# Patient Record
Sex: Female | Born: 1965
Health system: Southern US, Community
[De-identification: ages and names within clinical notes are randomized; demographics above are authoritative.]

## PROBLEM LIST (undated history)

## (undated) DIAGNOSIS — F419 Anxiety disorder, unspecified: Secondary | ICD-10-CM

## (undated) DIAGNOSIS — D649 Anemia, unspecified: Secondary | ICD-10-CM

## (undated) DIAGNOSIS — F32A Depression, unspecified: Secondary | ICD-10-CM

## (undated) DIAGNOSIS — I10 Essential (primary) hypertension: Secondary | ICD-10-CM

## (undated) DIAGNOSIS — R7303 Prediabetes: Secondary | ICD-10-CM

## (undated) DIAGNOSIS — F329 Major depressive disorder, single episode, unspecified: Secondary | ICD-10-CM

## (undated) DIAGNOSIS — I341 Nonrheumatic mitral (valve) prolapse: Secondary | ICD-10-CM

## (undated) DIAGNOSIS — M810 Age-related osteoporosis without current pathological fracture: Secondary | ICD-10-CM

## (undated) DIAGNOSIS — K219 Gastro-esophageal reflux disease without esophagitis: Secondary | ICD-10-CM

## (undated) DIAGNOSIS — M1990 Unspecified osteoarthritis, unspecified site: Secondary | ICD-10-CM

## (undated) DIAGNOSIS — C801 Malignant (primary) neoplasm, unspecified: Secondary | ICD-10-CM

## (undated) HISTORY — DX: Anxiety disorder, unspecified: F41.9

## (undated) HISTORY — DX: Malignant (primary) neoplasm, unspecified: C80.1

## (undated) HISTORY — DX: Unspecified osteoarthritis, unspecified site: M19.90

## (undated) HISTORY — PX: BUNIONECTOMY: SHX129

## (undated) HISTORY — DX: Depression, unspecified: F32.A

## (undated) HISTORY — PX: ABDOMINAL HYSTERECTOMY: SHX81

## (undated) HISTORY — DX: Prediabetes: R73.03

## (undated) HISTORY — DX: Nonrheumatic mitral (valve) prolapse: I34.1

## (undated) HISTORY — DX: Anemia, unspecified: D64.9

## (undated) HISTORY — DX: Age-related osteoporosis without current pathological fracture: M81.0

## (undated) HISTORY — DX: Essential (primary) hypertension: I10

## (undated) HISTORY — PX: PARTIAL HYSTERECTOMY: SHX80

## (undated) HISTORY — PX: WISDOM TOOTH EXTRACTION: SHX21

## (undated) HISTORY — PX: APPENDECTOMY: SHX54

## (undated) HISTORY — DX: Gastro-esophageal reflux disease without esophagitis: K21.9

## (undated) HISTORY — PX: OTHER SURGICAL HISTORY: SHX169

---
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
## 2001-05-11 ENCOUNTER — Other Ambulatory Visit: Admission: RE | Admit: 2001-05-11 | Discharge: 2001-05-11 | Payer: Self-pay | Admitting: Family Medicine

## 2002-06-11 ENCOUNTER — Other Ambulatory Visit: Admission: RE | Admit: 2002-06-11 | Discharge: 2002-06-11 | Payer: Self-pay | Admitting: Family Medicine

## 2003-01-03 ENCOUNTER — Other Ambulatory Visit: Admission: RE | Admit: 2003-01-03 | Discharge: 2003-01-03 | Payer: Self-pay | Admitting: Gastroenterology

## 2003-06-19 ENCOUNTER — Emergency Department (HOSPITAL_COMMUNITY): Admission: EM | Admit: 2003-06-19 | Discharge: 2003-06-19 | Payer: Self-pay | Admitting: Emergency Medicine

## 2003-06-20 ENCOUNTER — Encounter: Payer: Self-pay | Admitting: Family Medicine

## 2003-06-20 ENCOUNTER — Encounter (INDEPENDENT_AMBULATORY_CARE_PROVIDER_SITE_OTHER): Payer: Self-pay | Admitting: *Deleted

## 2003-06-20 ENCOUNTER — Inpatient Hospital Stay (HOSPITAL_COMMUNITY): Admission: RE | Admit: 2003-06-20 | Discharge: 2003-06-22 | Payer: Self-pay | Admitting: Family Medicine

## 2003-11-06 ENCOUNTER — Emergency Department (HOSPITAL_COMMUNITY): Admission: EM | Admit: 2003-11-06 | Discharge: 2003-11-07 | Payer: Self-pay | Admitting: Emergency Medicine

## 2004-12-30 ENCOUNTER — Other Ambulatory Visit: Admission: RE | Admit: 2004-12-30 | Discharge: 2004-12-30 | Payer: Self-pay | Admitting: Family Medicine

## 2010-04-08 ENCOUNTER — Encounter (INDEPENDENT_AMBULATORY_CARE_PROVIDER_SITE_OTHER): Payer: Self-pay | Admitting: Obstetrics and Gynecology

## 2010-04-08 ENCOUNTER — Ambulatory Visit (HOSPITAL_COMMUNITY): Admission: RE | Admit: 2010-04-08 | Discharge: 2010-04-08 | Payer: Self-pay | Admitting: Obstetrics and Gynecology

## 2010-12-26 LAB — CBC
HCT: 38.9 % (ref 36.0–46.0)
Hemoglobin: 13.5 g/dL (ref 12.0–15.0)
MCHC: 34.7 g/dL (ref 30.0–36.0)
MCV: 92.4 fL (ref 78.0–100.0)
Platelets: 143 10*3/uL — ABNORMAL LOW (ref 150–400)
RBC: 4.21 MIL/uL (ref 3.87–5.11)
RDW: 13.1 % (ref 11.5–15.5)
WBC: 3.7 10*3/uL — ABNORMAL LOW (ref 4.0–10.5)

## 2010-12-26 LAB — PREGNANCY, URINE: Preg Test, Ur: NEGATIVE

## 2010-12-26 LAB — SURGICAL PCR SCREEN
MRSA, PCR: NEGATIVE
Staphylococcus aureus: POSITIVE — AB

## 2011-02-25 NOTE — Op Note (Signed)
[name], Odonnell                            ACCOUNT NO.:  [id number]   MEDICAL RECORD NO.:  [id number]                   PATIENT TYPE:  INP   LOCATION:  0443                                 FACILITY:  [facility]   PHYSICIAN:  Velora Heckler, M.D.                DATE OF BIRTH:  08-04-1966   DATE OF PROCEDURE:  06/20/2003  DATE OF DISCHARGE:                                 OPERATIVE REPORT   PREOPERATIVE DIAGNOSIS:  Acute appendicitis.   POSTOPERATIVE DIAGNOSIS:  Acute gangrenous appendicitis with perforation.   PROCEDURE:  Laparoscopic appendectomy.   SURGEON:  Velora Heckler, M.D.   ANESTHESIA:  General.   ESTIMATED BLOOD LOSS:  Minimal.   PREPARATION:  Betadine.   COMPLICATIONS:  None.   INDICATIONS FOR PROCEDURE:  The patient is a 45 year old white female who  presents to the emergency department with a 48 hour history of abdominal  pain localizing to the right lower quadrant. The patient was seen by our  primary care physician. White blood cell count was elevated at 15.9. CT scan  of the abdomen and pelvis showed signs of acute appendicitis with probable  perforation. The patient was referred to general surgery for management.   DESCRIPTION OF PROCEDURE:  The procedure was done in OR #1 at the [facility]. The patient is brought to the operating room,  placed in a supine position on the operating room table. Following the  administration of general anesthesia, the patient is prepped and draped in  the usual strict aseptic fashion. After ascertaining that an adequate level  of anesthesia had been obtained, an infraumbilical incision was made in the  midline with a #15 blade. Dissection was carried down to the fascia. The  fascia was incised in the midline, the peritoneal cavity is entered  cautiously. A #0 Vicryl pursestring suture is placed in the fascia. A Hasson  cannula is introduced under direct vision and secured with a pursestring  suture. The  abdomen is insufflated with carbon dioxide. The laparoscope was  introduced under direct vision and the abdomen explored. There is green  purulent fluid present in the pelvis tracking up the right colic gutter and  surrounding the liver consistent with perforated appendicitis. There is a  dense phlegmon in the lower abdomen just to the right of midline. Small  bowel loops are adherent. These are bluntly mobilized after placing an  operating port in the right upper quadrant. Loops of small bowel are  dissected off of what appears to be a gangrenous appendix. With gentle blunt  dissection, the appendix is exposed. The base of the appendix appears normal  but the distal two-thirds is necrotic. The appendix is adherent to the  sigmoid colon and to the right ovary and fallopian tube. A second operative  port is placed in the left lower quadrant. Using two handed dissection  bluntly, the sigmoid  colon is separated from the appendix. Likewise the  appendix is mobilized off of the right ovary and fallopian tube. The  mesoappendix is then divided using the harmonic scalpel, dissection is  carried down to the base of the appendix. The appendix is completely freed  at the base and then base of the appendix is transected using an endoGIA  stapler with a vascular cartridge load. Good hemostasis is noted along the  staple line. The appendix is placed into an EndoCatch bag and withdrawn  through the left lower quadrant port. The port is reinserted. Using the  irrigation trocar, the entire abdomen is irrigated with warm saline. This is  the evacuated, good hemostasis is noted. Ports are removed under direct  vision. Pneumoperitoneum is released. A #0 Vicryl pursestring suture is tied  securely. Port sites are anesthetized with local anesthetic. Port sites are  closed with interrupted 4-0 Vicryl subcuticular sutures. Steri-Strips are  applied. Dry dressings are applied. The patient is awakened from  anesthesia  and brought to the recovery room in stable condition. The patient tolerated  the procedure well.                                               Velora Heckler, M.D.    TMG/MEDQ  D:  06/20/2003  T:  06/21/2003  Job:  [id number]   cc:   Molly Maduro L. Foy Guadalajara, M.D.  [address] [place]  [place] [address]  Fax: [phone]

## 2011-02-25 NOTE — H&P (Signed)
Dawn Odonnell, Dawn Odonnell                            ACCOUNT NO.:  [id number]   MEDICAL RECORD NO.:  [id number]                   PATIENT TYPE:  INP   LOCATION:  0443                                 FACILITY:  [facility]   PHYSICIAN:  Velora Heckler, M.D.                DATE OF BIRTH:  [birth year]   DATE OF ADMISSION:  06/20/2003  DATE OF DISCHARGE:                                HISTORY & PHYSICAL   REASON FOR ADMISSION:  Abdominal pain, acute appendicitis.   REFERRING PHYSICIAN:  Robert L. Foy Guadalajara, M.D. at [facility].   HISTORY OF PRESENT ILLNESS:  The patient is a pleasant 45 year old white  female who presents to the emergency room with greater then 48 hour history  of abdominal pain, localizing to the right lower quadrant.  This is  associated with nausea and chills.  The patient was seen at the emergency  department at [facility] on June 19, 2003.  Laboratory studies were  normal.  She was discharged home.  She took the laxative with some result,  however, her abdominal pain failed to resolve.  She saw Dr. Marinda Elk at  [facility] today and was sent back to [facility] where a white  blood cell count showed an elevated result of 15.9.  The patient  subsequently underwent CT scan of the abdomen which showed findings  consistent with acute appendicitis with possible perforation.  General  surgery is now contacted for evaluation and management.   PAST MEDICAL HISTORY:  1. History of hypertension.  2. Status post bilateral tubal ligation.  3. Status post foot surgery as a child.   MEDICATIONS:  1. Hydrochlorothiazide.  2. Phentermine, which is a diet pill.  3. DynaCirc for blood pressure.  4. Yasmin for birth control pill.  5. Vitamin supplements.   ALLERGIES:  None known.   SOCIAL HISTORY:  The patient is married.  She has one natural child age 47  years.  She does not smoke.  She drinks alcohol on a rare occasion.  She  denies drug use.  She is a Futures trader.   FAMILY HISTORY:  Essentially negative.   REVIEW OF SYSTEMS:  A 15 system review without significant other positives  except as noted above.   PHYSICAL EXAMINATION:  GENERAL:  A 45 year old well-developed, well-  nourished white female, mild to moderate discomfort on a stretcher in the  emergency department.  VITAL SIGNS:  Temperature 98.9, pulse 106, respirations 18, blood pressure  98/72.  HEENT:  Normocephalic.  Sclerae are clear.  Conjunctivae are clear.  Pupils  are equal and reactive.  Dentition is good.  Voice is normal.  NECK:  Anterior examination of the neck shows it to be symmetric.  Thyroid  is normal without nodularity.  There is no anterior posterior cervical  adenopathy.  There are no supraclavicular masses.  LUNGS:  Clear to auscultation bilaterally without rales  or rhonchi.  There  is no flank tenderness.  CARDIAC:  Regular rate and rhythm without murmur.  Peripheral pulses are  full.  ABDOMEN:  Soft.  There are bowel sounds on auscultation.  There is a well-  healed wound at the umbilicus with a small reducible umbilical hernia.  There is diffuse tenderness to palpation, maximum in the right lower  quadrant.  There is involuntary guarding of the rectus musculature of the  right lower quadrant.  There is rebound tenderness.  EXTREMITIES:  Nontender without edema.  NEUROLOGIC:  The patient is alert and oriented and without focal deficit.   LABORATORY DATA:  White count from June 20, 2003, at noon shows a WBC  of 15.9, a hemoglobin of 14.4, hematocrit 41.5%, platelet count 180,000.  Liver function tests were normal.  EKG shows sinus tachycardia.  CT scan of  the abdomen and pelvis shows findings consistent with acute appendicitis  with possible perforation.   IMPRESSION:  Acute appendicitis, possible perforation.   PLAN:  1. Admission to [facility].  2. Initiation of intravenous antibiotics.  3. To operating room for appendectomy.  4. Routine  postoperative care.   I discussed at length with the patient and her husband the indications for  surgery.  I explained laparoscopic versus open technique.  I explained that  we would be successful approximately 75% of the time with laparoscopic  technique, but with approximately a 25% conversion rate to an open  procedure.  We discussed the hospital course to be expected, ranging from  one to two days for an uncomplicated appendicitis to up to a week for  perforated appendicitis.  They understand and wish to proceed.  We will make  arrangements with the operating room immediately.                                                Velora Heckler, M.D.    TMG/MEDQ  D:  06/20/2003  T:  06/20/2003  Job:  [id number]   cc:   Molly Maduro L. Foy Guadalajara, M.D.  [address] [place]  [place] [address]  Fax: [phone]

## 2011-02-25 NOTE — Discharge Summary (Signed)
   Dawn Odonnell, Dawn Odonnell                            ACCOUNT NO.:  [id number]   MEDICAL RECORD NO.:  [id number]                   PATIENT TYPE:  INP   LOCATION:  0443                                 FACILITY:  [facility]   PHYSICIAN:  Velora Heckler, M.D.                DATE OF BIRTH:  [birth year]   DATE OF ADMISSION:  06/20/2003  DATE OF DISCHARGE:  06/22/2003                                 DISCHARGE SUMMARY   REASON FOR ADMISSION:  Acute appendicitis with perforation.   BRIEF HISTORY:  The patient is a 45 year old white female presented to the  emergency department with a 48 hour history of abdominal pain.  White blood  count was elevated.  CT scan showed findings consistent with perforated  appendicitis.   HOSPITAL COURSE:  The patient was seen and evaluated in the emergency  department.  She was taken directly to the operating room where she  underwent laparoscopic appendectomy.  Postoperatively, the patient received  48 hours of intravenous antibiotics.  She progressed nicely to a regular  diet without nausea or vomiting.  She was prepared for discharge home on the  second postoperative day.   DISCHARGE PLANNING:  The patient is discharged home today, June 22, 2003, in good condition, tolerating a regular diet, and ambulating  independently.  The patient will be seen back in my office at [facility] in two weeks.   DISCHARGE MEDICATIONS:  1. Augmentin 875 mg twice daily for one week.  2. She will also take Vicodin as needed for pain.   FINAL DIAGNOSIS:  Acute appendicitis with perforation.   CONDITION ON DISCHARGE:  Improved.                                               Velora Heckler, M.D.    TMG/MEDQ  D:  06/22/2003  T:  06/22/2003  Job:  [id number]   cc:   Molly Maduro L. Foy Guadalajara, M.D.  [address] [place]  [place] [address]  Fax: [phone]

## 2012-03-13 ENCOUNTER — Encounter: Payer: BC Managed Care – PPO | Attending: "Endocrinology | Admitting: *Deleted

## 2012-03-13 DIAGNOSIS — Z713 Dietary counseling and surveillance: Secondary | ICD-10-CM | POA: Insufficient documentation

## 2012-03-14 NOTE — Progress Notes (Signed)
Patient attended basic nutrition class on 03/13/12.  Topics covered include:   1. Complications of Hyperlipidemia and/or Hypertension. 2. Ways to reduce risk of heart disease.  3. Identifying fat and sodium content on food labels. 4. Ways to decrease sodium intake. 5. Optimal amount of daily saturated fat intake. 6. Optimal amount of daily sodium intake.  7. Foods to limit/avoid on a heart healthy diet. 8. MyPlate and portion control.   Patient to follow-up with [facility] prn.  

## 2013-10-10 HISTORY — PX: COLONOSCOPY: SHX174

## 2014-06-18 ENCOUNTER — Encounter: Payer: Self-pay | Admitting: Internal Medicine

## 2014-08-15 ENCOUNTER — Ambulatory Visit (AMBULATORY_SURGERY_CENTER): Payer: Self-pay | Admitting: *Deleted

## 2014-08-15 VITALS — Ht 63.0 in | Wt 175.2 lb

## 2014-08-15 DIAGNOSIS — Z1211 Encounter for screening for malignant neoplasm of colon: Secondary | ICD-10-CM

## 2014-08-15 MED ORDER — MOVIPREP 100 G PO SOLR: 1.0000 | Freq: Once | ORAL | Status: DC

## 2014-08-15 NOTE — Progress Notes (Signed)
No egg or soy allergy. ewm No home 02 use. ewm No diet pills. No blood thinners. ewm No issues with past sedation. ewm Pt declined emmi video. ewm

## 2014-08-29 ENCOUNTER — Ambulatory Visit (AMBULATORY_SURGERY_CENTER): Payer: BC Managed Care – PPO | Admitting: Internal Medicine

## 2014-08-29 ENCOUNTER — Encounter: Payer: Self-pay | Admitting: Internal Medicine

## 2014-08-29 VITALS — BP 119/68 | HR 53 | Temp 97.3°F | Resp 12 | Ht 63.0 in | Wt 175.0 lb

## 2014-08-29 DIAGNOSIS — D120 Benign neoplasm of cecum: Secondary | ICD-10-CM

## 2014-08-29 DIAGNOSIS — Z1211 Encounter for screening for malignant neoplasm of colon: Secondary | ICD-10-CM

## 2014-08-29 MED ORDER — SODIUM CHLORIDE 0.9 % IV SOLN: 500.0000 mL | INTRAVENOUS | Status: DC

## 2014-08-29 NOTE — Progress Notes (Signed)
Called to room to assist during endoscopic procedure.  Patient ID and intended procedure confirmed with present staff. Received instructions for my participation in the procedure from the performing physician.  

## 2014-08-29 NOTE — Op Note (Signed)
[facility]  [address] [place], [address]   COLONOSCOPY PROCEDURE REPORT  PATIENT: Dawn Odonnell, Dawn Odonnell  MR#: [id number] BIRTHDATE: [birth year] , 48  yrs. old GENDER: female ENDOSCOPIST: Jerene Bears, MD REFERRED BY: Corine Shelter PROCEDURE DATE:  08/29/2014 PROCEDURE:   Colonoscopy with snare polypectomy First Screening Colonoscopy - Avg.  risk and is 50 yrs.  old or older Yes.  Prior Negative Screening - Now for repeat screening. N/A  History of Adenoma - Now for follow-up colonoscopy & has been > or = to 3 yrs.  N/A  Polyps Removed Today? Yes. ASA CLASS:   Class II INDICATIONS:average risk for colon cancer and first colonoscopy. MEDICATIONS: Monitored anesthesia care and Propofol 240 mg IV  DESCRIPTION OF PROCEDURE:   After the risks benefits and alternatives of the procedure were thoroughly explained, informed consent was obtained.  The digital rectal exam revealed no rectal mass.   The LB [id number]  endoscope was introduced through the anus and advanced to the cecum, which was identified by both the appendix and ileocecal valve. No adverse events experienced. The quality of the prep was good, using MoviPrep  The instrument was then slowly withdrawn as the colon was fully examined.   COLON FINDINGS: A sessile polyp measuring 6 mm in size was found at the cecum.  A polypectomy was performed with a cold snare.   There was mild diverticulosis noted in the descending colon, sigmoid colon, and ascending colon.  Retroflexed views revealed internal hemorrhoids. The time to cecum=3 minutes 24 seconds.  Withdrawal time=10 minutes 23 seconds.  The scope was withdrawn and the procedure completed. COMPLICATIONS: There were no immediate complications.  ENDOSCOPIC IMPRESSION: 1.   Sessile polyp was found at the cecum; polypectomy was performed with a cold snare 2.   Mild diverticulosis was noted in the descending colon, sigmoid colon, and ascending  colon  RECOMMENDATIONS: 1.  Await pathology results 2.  If the polyp removed today is proven to be an adenomatous (pre-cancerous) polyp, you will need a repeat colonoscopy in 5 years.  Otherwise you should continue to follow colorectal cancer screening guidelines for "routine risk" patients with colonoscopy in 10 years.  You will receive a letter within 1-2 weeks with the results of your biopsy as well as final recommendations.  Please call my office if you have not received a letter after 3 weeks.  eSigned:  Jerene Bears, MD 08/29/2014 12:45 PM   cc: The Patient, PCP

## 2014-08-29 NOTE — Progress Notes (Signed)
Procedure ends, to recovery, report given and VSS. 

## 2014-08-29 NOTE — Patient Instructions (Signed)
YOU HAD AN ENDOSCOPIC PROCEDURE TODAY AT [facility]: Refer to the procedure report that was given to you for any specific questions about what was found during the examination.  If the procedure report does not answer your questions, please call your gastroenterologist to clarify.  If you requested that your care partner not be given the details of your procedure findings, then the procedure report has been included in a sealed envelope for you to review at your convenience later.  YOU SHOULD EXPECT: Some feelings of bloating in the abdomen. Passage of more gas than usual.  Walking can help get rid of the air that was put into your GI tract during the procedure and reduce the bloating. If you had a lower endoscopy (such as a colonoscopy or flexible sigmoidoscopy) you may notice spotting of blood in your stool or on the toilet paper. If you underwent a bowel prep for your procedure, then you may not have a normal bowel movement for a few days.  DIET: Your first meal following the procedure should be a light meal and then it is ok to progress to your normal diet.  A half-sandwich or bowl of soup is an example of a good first meal.  Heavy or fried foods are harder to digest and may make you feel nauseous or bloated.  Likewise meals heavy in dairy and vegetables can cause extra gas to form and this can also increase the bloating.  Drink plenty of fluids but you should avoid alcoholic beverages for 24 hours.  ACTIVITY: Your care partner should take you home directly after the procedure.  You should plan to take it easy, moving slowly for the rest of the day.  You can resume normal activity the day after the procedure however you should NOT DRIVE or use heavy machinery for 24 hours (because of the sedation medicines used during the test).    SYMPTOMS TO REPORT IMMEDIATELY: A gastroenterologist can be reached at any hour.  During normal business hours, 8:30 AM to 5:00 PM Monday through Friday,  call [phone].  After hours and on weekends, please call the GI answering service at [phone] who will take a message and have the physician on call contact you.   Following lower endoscopy (colonoscopy or flexible sigmoidoscopy):  Excessive amounts of blood in the stool  Significant tenderness or worsening of abdominal pains  Swelling of the abdomen that is new, acute  Fever of 100F or higher   FOLLOW UP: If any biopsies were taken you will be contacted by phone or by letter within the next 1-3 weeks.  Call your gastroenterologist if you have not heard about the biopsies in 3 weeks.  Our staff will call the home number listed on your records the next business day following your procedure to check on you and address any questions or concerns that you may have at that time regarding the information given to you following your procedure. This is a courtesy call and so if there is no answer at the home number and we have not heard from you through the emergency physician on call, we will assume that you have returned to your regular daily activities without incident.  Diverticulosis and polyp information given.  SIGNATURES/CONFIDENTIALITY: You and/or your care partner have signed paperwork which will be entered into your electronic medical record.  These signatures attest to the fact that that the information above on your After Visit Summary has been reviewed and is understood.  Full responsibility of the confidentiality of this discharge information lies with you and/or your care-partner.

## 2014-09-01 ENCOUNTER — Telehealth: Payer: Self-pay | Admitting: *Deleted

## 2014-09-01 NOTE — Telephone Encounter (Signed)
  Follow up Call-  Call back number 08/29/2014  Post procedure Call Back phone  # [phone]  Permission to leave phone message Yes     Patient questions:  Do you have a fever, pain , or abdominal swelling? No. Pain Score  0 *  Have you tolerated food without any problems? Yes.    Have you been able to return to your normal activities? No.  Do you have any questions about your discharge instructions: Diet   No. Medications  No. Follow up visit  No.  Do you have questions or concerns about your Care? No.  Actions: * If pain score is 4 or above: No action needed, pain <4.

## 2014-09-03 ENCOUNTER — Encounter: Payer: Self-pay | Admitting: Internal Medicine

## 2015-07-15 ENCOUNTER — Encounter (HOSPITAL_COMMUNITY): Payer: Self-pay

## 2015-07-15 ENCOUNTER — Emergency Department (HOSPITAL_COMMUNITY)
Admission: EM | Admit: 2015-07-15 | Discharge: 2015-07-15 | Disposition: A | Discharge status: Home / Self Care | Payer: BLUE CROSS/BLUE SHIELD | Attending: Emergency Medicine | Admitting: Emergency Medicine

## 2015-07-15 ENCOUNTER — Emergency Department (HOSPITAL_COMMUNITY): Payer: BLUE CROSS/BLUE SHIELD

## 2015-07-15 DIAGNOSIS — I10 Essential (primary) hypertension: Secondary | ICD-10-CM | POA: Insufficient documentation

## 2015-07-15 DIAGNOSIS — M545 Low back pain: Secondary | ICD-10-CM | POA: Diagnosis not present

## 2015-07-15 DIAGNOSIS — M810 Age-related osteoporosis without current pathological fracture: Secondary | ICD-10-CM | POA: Insufficient documentation

## 2015-07-15 DIAGNOSIS — Z79899 Other long term (current) drug therapy: Secondary | ICD-10-CM | POA: Diagnosis not present

## 2015-07-15 DIAGNOSIS — R0602 Shortness of breath: Secondary | ICD-10-CM | POA: Insufficient documentation

## 2015-07-15 DIAGNOSIS — R42 Dizziness and giddiness: Secondary | ICD-10-CM | POA: Insufficient documentation

## 2015-07-15 DIAGNOSIS — R079 Chest pain, unspecified: Secondary | ICD-10-CM

## 2015-07-15 DIAGNOSIS — K219 Gastro-esophageal reflux disease without esophagitis: Secondary | ICD-10-CM | POA: Insufficient documentation

## 2015-07-15 DIAGNOSIS — Z862 Personal history of diseases of the blood and blood-forming organs and certain disorders involving the immune mechanism: Secondary | ICD-10-CM | POA: Diagnosis not present

## 2015-07-15 LAB — BASIC METABOLIC PANEL
Anion gap: 6 (ref 5–15)
BUN: 20 mg/dL (ref 6–20)
CO2: 26 mmol/L (ref 22–32)
Calcium: 9.4 mg/dL (ref 8.9–10.3)
Chloride: 102 mmol/L (ref 101–111)
Creatinine, Ser: 0.9 mg/dL (ref 0.44–1.00)
GFR calc Af Amer: >60 mL/min (ref >60)
GFR calc non Af Amer: >60 mL/min (ref >60)
Glucose, Bld: 101 mg/dL — ABNORMAL HIGH (ref 65–99)
Potassium: 3.8 mmol/L (ref 3.5–5.1)
Sodium: 134 mmol/L — ABNORMAL LOW (ref 135–145)

## 2015-07-15 LAB — CBC
HCT: 42.8 % (ref 36.0–46.0)
Hemoglobin: 14.6 g/dL (ref 12.0–15.0)
MCH: 31.2 pg (ref 26.0–34.0)
MCHC: 34.1 g/dL (ref 30.0–36.0)
MCV: 91.5 fL (ref 78.0–100.0)
Platelets: 208 10*3/uL (ref 150–400)
RBC: 4.68 MIL/uL (ref 3.87–5.11)
RDW: 12.6 % (ref 11.5–15.5)
WBC: 5.6 10*3/uL (ref 4.0–10.5)

## 2015-07-15 LAB — I-STAT TROPONIN, ED
Troponin i, poc: 0 ng/mL (ref 0.00–0.08)
Troponin i, poc: 0 ng/mL (ref 0.00–0.08)

## 2015-07-15 NOTE — ED Provider Notes (Signed)
CSN: [id number]     Arrival date & time 07/15/15  1707 History   First MD Initiated Contact with Patient 07/15/15 1745     Chief Complaint  Patient presents with  . Chest Pain  . Back Pain  . Shortness of Breath   Dawn Odonnell is a 49 y.o. female with a history of mitral valve prolapse, hypertension, and GERD who presents to the emergency department after an episode of chest tightness, with associated shortness of breath, sweating, and near-syncope. The patient reports she was sitting at her desk around 4:45 PM when she had chest tightness that went from her chest or back, felt lightheaded, shortness of breath and sweaty. She felt like she might pass out. She walked to a coworker and told that she needed to go the hospital and felt her vision go dark. She never passed out. She denies syncope, falls or head injury. She reports that her episode of chest tightness and lightheadedness lasted approximately 1 minute. Currently she reports feeling weak and "jittery." She denies any current chest pain or shortness of breath. She does report a history of her father with an MI in his 59s. She also has a history of hypertension. Patient reports she has been taking a supplement called Le-Vel Thrive for the past several months as a appetite suppressant. She reports she has been eating less over the past several months. She has recent changes to her medications or supplements. The patient denies fevers, chills, palpitations, history of asthma, cough, wheezing, rashes, history of MI, leg pain, leg swelling, abdominal pain, nausea or vomiting.  (Consider location/radiation/quality/duration/timing/severity/associated sxs/prior Treatment) HPI  Past Medical History  Diagnosis Date  . Anemia     past hx.  . Arthritis   . GERD (gastroesophageal reflux disease)   . MVP (mitral valve prolapse)   . Hypertension   . Osteoporosis     as child   . Prediabetes     was 89, no medicines    Past Surgical History   Procedure Laterality Date  . Tubal ligation    . Partial hysterectomy    . Bunionectomy      x2  . Appendectomy    . Abdominal hysterectomy     Family History  Problem Relation Age of Onset  . Colon cancer Neg Hx   . Esophageal cancer Neg Hx   . Rectal cancer Neg Hx   . Stomach cancer Neg Hx   . Hypertension Mother   . Stroke Mother   . Neuropathy Mother   . Heart disease Father   . Diabetes Father    Social History  Substance Use Topics  . Smoking status: Never Smoker   . Smokeless tobacco: Never Used  . Alcohol Use: 0.0 oz/week    0 Standard drinks or equivalent per week     Comment: very rare    OB History    No data available     Review of Systems  Constitutional: Negative for fever and chills.  HENT: Negative for congestion and trouble swallowing.   Eyes: Negative for visual disturbance.  Respiratory: Positive for shortness of breath. Negative for cough and wheezing.   Cardiovascular: Positive for chest pain. Negative for palpitations and leg swelling.  Gastrointestinal: Negative for nausea, vomiting, diarrhea and abdominal distention.  Genitourinary: Negative for dysuria and difficulty urinating.  Musculoskeletal: Negative for back pain, joint swelling and neck pain.  Skin: Negative for rash and wound.  Neurological: Positive for light-headedness. Negative for dizziness, syncope, weakness  and headaches.      Allergies  Sulfa antibiotics  Home Medications   Prior to Admission medications   Medication Sig Start Date End Date Taking? Authorizing Provider  calcium carbonate (OS-CAL) 600 MG TABS tablet Take 600 mg by mouth 2 (two) times daily with a meal.   Yes Historical Provider, MD  Fish Oil-Cholecalciferol (FISH OIL + D3 PO) Take by mouth.   Yes Historical Provider, MD  FLUoxetine (PROZAC) 20 MG capsule Take 20 mg by mouth daily.   Yes Historical Provider, MD  Glucosamine-Chondroit-Vit C-Mn (GLUCOSAMINE 1500 COMPLEX) CAPS Take 1 capsule by mouth daily.    Yes Historical Provider, MD  lisinopril (PRINIVIL,ZESTRIL) 10 MG tablet Take 10 mg by mouth daily.   Yes Historical Provider, MD  MELATONIN PO Take 15 mg by mouth at bedtime as needed.     Historical Provider, MD   BP 125/70 mmHg  Pulse 69  Temp(Src) 97.7 F (36.5 C) (Oral)  Resp 12  SpO2 100%  LMP 07/28/2014 Physical Exam  Constitutional: She is oriented to person, place, and time. She appears well-developed and well-nourished. No distress.  Nontoxic appearing.  HENT:  Head: Normocephalic and atraumatic.  Mouth/Throat: Oropharynx is clear and moist.  Eyes: Conjunctivae are normal. Pupils are equal, round, and reactive to light. Right eye exhibits no discharge. Left eye exhibits no discharge.  Neck: Normal range of motion. Neck supple. No JVD present. No tracheal deviation present.  Cardiovascular: Normal rate, regular rhythm, normal heart sounds and intact distal pulses.  Exam reveals no gallop and no friction rub.   Bilateral radial, posterior tibialis and dorsalis pedis pulses are intact.    Pulmonary/Chest: Effort normal and breath sounds normal. No respiratory distress. She has no wheezes. She has no rales. She exhibits no tenderness.  Lungs are clear to auscultation bilaterally.  Abdominal: Soft. She exhibits no distension. There is no tenderness. There is no guarding.  Musculoskeletal: She exhibits no edema or tenderness.  No lower extremity edema or tenderness.  Lymphadenopathy:    She has no cervical adenopathy.  Neurological: She is alert and oriented to person, place, and time. Coordination normal.  Skin: Skin is warm and dry. No rash noted. She is not diaphoretic. No erythema. No pallor.  Psychiatric: She has a normal mood and affect. Her behavior is normal.  Nursing note and vitals reviewed.   ED Course  Procedures (including critical care time) Labs Review Labs Reviewed  BASIC METABOLIC PANEL - Abnormal; Notable for the following:    Sodium 134 (*)    Glucose,  Bld 101 (*)    All other components within normal limits  CBC  I-STAT TROPOININ, ED  Randolm Idol, ED    Imaging Review Dg Chest 2 View  07/15/2015   CLINICAL DATA:  49 year old female with chest tightness and near-syncope  EXAM: CHEST  2 VIEW  COMPARISON:  None.  FINDINGS: The lungs are clear and negative for focal airspace consolidation, pulmonary edema or suspicious pulmonary nodule. Artifact projects over both upper lungs from a radiolucent EKG leads. No pleural effusion or pneumothorax. Cardiac and mediastinal contours are within normal limits. No acute fracture or lytic or blastic osseous lesions. The visualized upper abdominal bowel gas pattern is unremarkable.  IMPRESSION: Negative chest x-ray.   Electronically Signed   By: Jacqulynn Cadet M.D.   On: 07/15/2015 18:15   I have personally reviewed and evaluated these images and lab results as part of my medical decision-making.   EKG Interpretation None  ED ECG REPORT   Date: 07/15/2015  Rate: 58  Rhythm: sinus arrhythmia  QRS Axis: normal  Intervals: normal  ST/T Wave abnormalities: normal  Conduction Disutrbances:none  Narrative Interpretation:   Old EKG Reviewed: none available  I have personally reviewed the EKG tracing and agree with the computerized printout as noted.   Filed Vitals:   07/15/15 1721 07/15/15 1726 07/15/15 1808  BP: 116/73  125/70  Pulse: 56 56 69  Temp: 97.7 F (36.5 C) 97.7 F (36.5 C)   TempSrc: Oral Oral   Resp: 10  12  SpO2: 99%  100%     MDM   Meds given in ED:  Medications - No data to display  New Prescriptions   No medications on file    Final diagnoses:  Chest pain, unspecified chest pain type   This is a 49 y.o. female with a history of mitral valve prolapse, hypertension, and GERD who presents to the emergency department after an episode of chest tightness, with associated shortness of breath, sweating, and near-syncope. The patient reports she was sitting at her  desk around 4:45 PM when she had chest tightness that went from her chest or back, felt lightheaded, shortness of breath and sweaty. She felt like she might pass out. She walked to a coworker and told that she needed to go the hospital and felt her vision go dark. She never passed out. She denies syncope, falls or head injury. She reports that her episode of chest tightness and lightheadedness lasted approximately 1 minute.   Patient is to be discharged with recommendation to follow up with PCP and cardiology for stress test in regards to today's hospital visit. Chest pain is not likely of cardiac or pulmonary etiology due to presentation, perc negative, VSS, no tracheal deviation, no JVD or new murmur, RRR, breath sounds equal bilaterally, EKG without acute abnormalities, negative troponin, negative delta troponin and negative CXR. HEART score is 3. Prior to discharge the patient reports feeling back to baseline. She denies lightheadedness.  Patient has been advised to return to the ED if chest pain becomes exertional, associated with diaphoresis or nausea, radiates to left jaw/arm, worsens or becomes concerning in any way. Patient appears reliable for follow up and is agreeable to discharge. I advised the patient to follow-up with their primary care provider this week. I advised the patient to return to the emergency department with new or worsening symptoms or new concerns. The patient verbalized understanding and agreement with plan.    This patient was discussed with Dr. Laneta Simmers who agrees with assessment and plan.      Waynetta Pean, PA-C 07/15/15 2147  Leo Grosser, MD 07/16/15 [phone]

## 2015-07-15 NOTE — Discharge Instructions (Signed)
Nonspecific Chest Pain  °Chest pain can be caused by many different conditions. There is always a chance that your pain could be related to something serious, such as a heart attack or a blood clot in your lungs. Chest pain can also be caused by conditions that are not life-threatening. If you have chest pain, it is very important to follow up with your health care provider. °CAUSES  °Chest pain can be caused by: °· Heartburn. °· Pneumonia or bronchitis. °· Anxiety or stress. °· Inflammation around your heart (pericarditis) or lung (pleuritis or pleurisy). °· A blood clot in your lung. °· A collapsed lung (pneumothorax). It can develop suddenly on its own (spontaneous pneumothorax) or from trauma to the chest. °· Shingles infection (varicella-zoster virus). °· Heart attack. °· Damage to the bones, muscles, and cartilage that make up your chest wall. This can include: °¨ Bruised bones due to injury. °¨ Strained muscles or cartilage due to frequent or repeated coughing or overwork. °¨ Fracture to one or more ribs. °¨ Sore cartilage due to inflammation (costochondritis). °RISK FACTORS  °Risk factors for chest pain may include: °· Activities that increase your risk for trauma or injury to your chest. °· Respiratory infections or conditions that cause frequent coughing. °· Medical conditions or overeating that can cause heartburn. °· Heart disease or family history of heart disease. °· Conditions or health behaviors that increase your risk of developing a blood clot. °· Having had chicken pox (varicella zoster). °SIGNS AND SYMPTOMS °Chest pain can feel like: °· Burning or tingling on the surface of your chest or deep in your chest. °· Crushing, pressure, aching, or squeezing pain. °· Dull or sharp pain that is worse when you move, cough, or take a deep breath. °· Pain that is also felt in your back, neck, shoulder, or arm, or pain that spreads to any of these areas. °Your chest pain may come and go, or it may stay  constant. °DIAGNOSIS °Lab tests or other studies may be needed to find the cause of your pain. Your health care provider may have you take a test called an ambulatory ECG (electrocardiogram). An ECG records your heartbeat patterns at the time the test is performed. You may also have other tests, such as: °· Transthoracic echocardiogram (TTE). During echocardiography, sound waves are used to create a picture of all of the heart structures and to look at how blood flows through your heart. °· Transesophageal echocardiogram (TEE). This is a more advanced imaging test that obtains images from inside your body. It allows your health care provider to see your heart in finer detail. °· Cardiac monitoring. This allows your health care provider to monitor your heart rate and rhythm in real time. °· Holter monitor. This is a portable device that records your heartbeat and can help to diagnose abnormal heartbeats. It allows your health care provider to track your heart activity for several days, if needed. °· Stress tests. These can be done through exercise or by taking medicine that makes your heart beat more quickly. °· Blood tests. °· Imaging tests. °TREATMENT  °Your treatment depends on what is causing your chest pain. Treatment may include: °· Medicines. These may include: °¨ Acid blockers for heartburn. °¨ Anti-inflammatory medicine. °¨ Pain medicine for inflammatory conditions. °¨ Antibiotic medicine, if an infection is present. °¨ Medicines to dissolve blood clots. °¨ Medicines to treat coronary artery disease. °· Supportive care for conditions that do not require medicines. This may include: °¨ Resting. °¨ Applying heat   or cold packs to injured areas. °¨ Limiting activities until pain decreases. °HOME CARE INSTRUCTIONS °· If you were prescribed an antibiotic medicine, finish it all even if you start to feel better. °· Avoid any activities that bring on chest pain. °· Do not use any tobacco products, including  cigarettes, chewing tobacco, or electronic cigarettes. If you need help quitting, ask your health care provider. °· Do not drink alcohol. °· Take medicines only as directed by your health care provider. °· Keep all follow-up visits as directed by your health care provider. This is important. This includes any further testing if your chest pain does not go away. °· If heartburn is the cause for your chest pain, you may be told to keep your head raised (elevated) while sleeping. This reduces the chance that acid will go from your stomach into your esophagus. °· Make lifestyle changes as directed by your health care provider. These may include: °¨ Getting regular exercise. Ask your health care provider to suggest some activities that are safe for you. °¨ Eating a heart-healthy diet. A registered dietitian can help you to learn healthy eating options. °¨ Maintaining a healthy weight. °¨ Managing diabetes, if necessary. °¨ Reducing stress. °SEEK MEDICAL CARE IF: °· Your chest pain does not go away after treatment. °· You have a rash with blisters on your chest. °· You have a fever. °SEEK IMMEDIATE MEDICAL CARE IF:  °· Your chest pain is worse. °· You have an increasing cough, or you cough up blood. °· You have severe abdominal pain. °· You have severe weakness. °· You faint. °· You have chills. °· You have sudden, unexplained chest discomfort. °· You have sudden, unexplained discomfort in your arms, back, neck, or jaw. °· You have shortness of breath at any time. °· You suddenly start to sweat, or your skin gets clammy. °· You feel nauseous or you vomit. °· You suddenly feel light-headed or dizzy. °· Your heart begins to beat quickly, or it feels like it is skipping beats. °These symptoms may represent a serious problem that is an emergency. Do not wait to see if the symptoms will go away. Get medical help right away. Call your local emergency services (911 in the [place] Do not drive yourself to the hospital. °  °This  information is not intended to replace advice given to you by your health care provider. Make sure you discuss any questions you have with your health care provider. °  °Document Released: 07/06/2005 Document Revised: 10/17/2014 Document Reviewed: 05/02/2014 °Elsevier Interactive Patient Education ©2016 [employer] ° °

## 2015-07-15 NOTE — ED Notes (Signed)
Pt c/o generalized chest tightness, upper/mid back pain, SOB, weakness, and lightheadedness starting today.  Pain score 2/10.  Pt reports that she works in a [employer] zone and is concerned about inhaling fumes/dust.  Hx of HTN.

## 2017-08-17 ENCOUNTER — Other Ambulatory Visit: Payer: Self-pay | Admitting: Obstetrics and Gynecology

## 2017-08-17 ENCOUNTER — Other Ambulatory Visit (HOSPITAL_COMMUNITY): Payer: Self-pay | Admitting: Diagnostic Radiology

## 2017-08-17 ENCOUNTER — Ambulatory Visit
Admission: RE | Admit: 2017-08-17 | Discharge: 2017-08-17 | Disposition: A | Discharge status: Home / Self Care | Payer: BLUE CROSS/BLUE SHIELD | Source: Ambulatory Visit | Attending: Obstetrics and Gynecology | Admitting: Obstetrics and Gynecology

## 2017-08-17 DIAGNOSIS — R928 Other abnormal and inconclusive findings on diagnostic imaging of breast: Secondary | ICD-10-CM

## 2018-04-25 ENCOUNTER — Emergency Department (HOSPITAL_COMMUNITY)
Admission: EM | Admit: 2018-04-25 | Discharge: 2018-04-26 | Disposition: A | Discharge status: Home / Self Care | Payer: BLUE CROSS/BLUE SHIELD | Attending: Emergency Medicine | Admitting: Emergency Medicine

## 2018-04-25 ENCOUNTER — Emergency Department (HOSPITAL_COMMUNITY): Payer: BLUE CROSS/BLUE SHIELD

## 2018-04-25 ENCOUNTER — Encounter (HOSPITAL_COMMUNITY): Payer: Self-pay

## 2018-04-25 ENCOUNTER — Other Ambulatory Visit: Payer: Self-pay

## 2018-04-25 DIAGNOSIS — I10 Essential (primary) hypertension: Secondary | ICD-10-CM | POA: Diagnosis not present

## 2018-04-25 DIAGNOSIS — M25512 Pain in left shoulder: Secondary | ICD-10-CM

## 2018-04-25 DIAGNOSIS — Z79899 Other long term (current) drug therapy: Secondary | ICD-10-CM | POA: Diagnosis not present

## 2018-04-25 LAB — BASIC METABOLIC PANEL
Anion gap: 8 (ref 5–15)
BUN: 14 mg/dL (ref 6–20)
CO2: 26 mmol/L (ref 22–32)
Calcium: 9.4 mg/dL (ref 8.9–10.3)
Chloride: 106 mmol/L (ref 98–111)
Creatinine, Ser: 1 mg/dL (ref 0.44–1.00)
GFR calc Af Amer: >60 mL/min (ref >60)
GFR calc non Af Amer: >60 mL/min (ref >60)
Glucose, Bld: 107 mg/dL — ABNORMAL HIGH (ref 70–99)
Potassium: 3.9 mmol/L (ref 3.5–5.1)
Sodium: 140 mmol/L (ref 135–145)

## 2018-04-25 LAB — CBC
HCT: 40.1 % (ref 36.0–46.0)
Hemoglobin: 13.4 g/dL (ref 12.0–15.0)
MCH: 31.5 pg (ref 26.0–34.0)
MCHC: 33.4 g/dL (ref 30.0–36.0)
MCV: 94.1 fL (ref 78.0–100.0)
Platelets: 213 10*3/uL (ref 150–400)
RBC: 4.26 MIL/uL (ref 3.87–5.11)
RDW: 12.1 % (ref 11.5–15.5)
WBC: 4.8 10*3/uL (ref 4.0–10.5)

## 2018-04-25 LAB — I-STAT BETA HCG BLOOD, ED (MC, WL, AP ONLY): I-stat hCG, quantitative: <5 m[IU]/mL (ref <5)

## 2018-04-25 LAB — I-STAT TROPONIN, ED: Troponin i, poc: 0 ng/mL (ref 0.00–0.08)

## 2018-04-25 NOTE — ED Triage Notes (Signed)
Pt states that for the past four days she has been having L shoulder pain and L arm pain, constant pain, denies CP, no weakness or neuro deficits.

## 2018-04-25 NOTE — ED Notes (Signed)
Results reviewed.  No changes in acuity at this time 

## 2018-04-26 NOTE — ED Provider Notes (Signed)
[facility] EMERGENCY DEPARTMENT Provider Note   CSN: [id number] Arrival date & time: 04/25/18  2007     History   Chief Complaint Chief Complaint  Patient presents with  . Shoulder Pain    HPI Dawn Odonnell is a 52 y.o. female.  52 year old female with a history of arthritis, esophageal reflux, mitral valve prolapse, hypertension presents to the emergency department for evaluation of left shoulder pain.  Shoulder pain has been present around her mid shoulder blade x4 days.  She reports the pain to be fairly constant.  It is slightly alleviated when lying flat, but aggravated with certain positions.  She denies the ability to reproduce her pain as well as any aggravation with left arm movement.  No recent strenuous activity, heavy lifting, trauma.  She has had little relief from Tylenol, but did take an ibuprofen with some mild, temporary improvement.  She went to see her chiropractor today who advised her to be evaluated in the ED given elevated blood pressure with systolic around 737.  She denies any associated fevers, chest pain, shortness of breath, diaphoresis, leg swelling, extremity numbness or paresthesias, extremity weakness.  No recent surgeries or hospitalizations.  Denies prior history of ACS, DVT, PE.  She does have a history of ACS in her father and paternal grandfather as well as maternal grandmother.  The history is provided by the patient. No language interpreter was used.  Shoulder Pain      Past Medical History:  Diagnosis Date  . Anemia    past hx.  . Arthritis   . GERD (gastroesophageal reflux disease)   . Hypertension   . MVP (mitral valve prolapse)   . Osteoporosis    as child   . Prediabetes    was 89, no medicines     There are no active problems to display for this patient.   Past Surgical History:  Procedure Laterality Date  . ABDOMINAL HYSTERECTOMY    . APPENDECTOMY    . BUNIONECTOMY     x2  . PARTIAL HYSTERECTOMY    .  tubal ligation       OB History   None      Home Medications    Prior to Admission medications   Medication Sig Start Date End Date Taking? Authorizing Provider  calcium carbonate (OS-CAL) 600 MG TABS tablet Take 600 mg by mouth 2 (two) times daily with a meal.    [provider]  Fish Oil-Cholecalciferol (FISH OIL + D3 PO) Take by mouth.    [provider]  FLUoxetine (PROZAC) 20 MG capsule Take 20 mg by mouth daily.    [provider]  Glucosamine-Chondroit-Vit C-Mn (GLUCOSAMINE 1500 COMPLEX) CAPS Take 1 capsule by mouth daily.    [provider]  lisinopril (PRINIVIL,ZESTRIL) 10 MG tablet Take 10 mg by mouth daily.    [provider]  MELATONIN PO Take 15 mg by mouth at bedtime as needed.     [provider]    Family History Family History  Problem Relation Age of Onset  . Hypertension Mother   . Stroke Mother   . Neuropathy Mother   . Heart disease Father   . Diabetes Father   . Colon cancer Neg Hx   . Esophageal cancer Neg Hx   . Rectal cancer Neg Hx   . Stomach cancer Neg Hx     Social History Social History   Tobacco Use  . Smoking status: Never Smoker  .  Smokeless tobacco: Never Used  Substance Use Topics  . Alcohol use: Yes    Alcohol/week: 0.0 oz    Comment: very rare   . Drug use: No     Allergies   Sulfa antibiotics   Review of Systems Review of Systems Ten systems reviewed and are negative for acute change, except as noted in the HPI.    Physical Exam Updated Vital Signs BP (!) 155/98 (BP Location: Right Arm)   Pulse 73   Temp 97.8 F (36.6 C) (Oral)   Resp 16   LMP 07/28/2014   SpO2 98%   Physical Exam  Constitutional: She is oriented to person, place, and time. She appears well-developed and well-nourished. No distress.  Nontoxic appearing and in NAD  HENT:  Head: Normocephalic and atraumatic.  Eyes: Conjunctivae and EOM are normal. No scleral icterus.  Neck: Normal range of  motion.  Cardiovascular: Normal rate, regular rhythm and intact distal pulses.  Pulmonary/Chest: Effort normal. No stridor. No respiratory distress. She has no wheezes. She has no rales.  Respirations even and unlabored  Musculoskeletal: Normal range of motion.  Full ROM of the LUE. No reproducible TTP. No deformity or crepitus.  Neurological: She is alert and oriented to person, place, and time. She exhibits normal muscle tone. Coordination normal.  GCS 15. Moving all extremities.  Skin: Skin is warm and dry. No rash noted. She is not diaphoretic. No erythema. No pallor.  Psychiatric: She has a normal mood and affect. Her behavior is normal.  Nursing note and vitals reviewed.    ED Treatments / Results  Labs (all labs ordered are listed, but only abnormal results are displayed) Labs Reviewed  BASIC METABOLIC PANEL - Abnormal; Notable for the following components:      Result Value   Glucose, Bld 107 (*)    All other components within normal limits  CBC  I-STAT TROPONIN, ED  I-STAT BETA HCG BLOOD, ED ([facility], [facility], [facility] ONLY)    EKG ED ECG REPORT   Date: 04/26/2018  Rate: 60  Rhythm: normal sinus rhythm  QRS Axis: normal  Intervals: normal  ST/T Wave abnormalities: nonspecific T wave changes  Conduction Disutrbances:none  Narrative Interpretation: NSR; no STEMI or ischemic change.  Old EKG Reviewed: none available  I have personally reviewed the EKG tracing and agree with the computerized printout as noted.   Radiology Dg Chest 2 View  Result Date: 04/25/2018 CLINICAL DATA:  Arm pain EXAM: CHEST - 2 VIEW COMPARISON:  None. FINDINGS: The heart size and mediastinal contours are within normal limits. Both lungs are clear. The visualized skeletal structures are unremarkable. IMPRESSION: No active cardiopulmonary disease. Electronically Signed   By: Ulyses Jarred M.D.   On: 04/25/2018 21:47    Procedures Procedures (including critical care time)  Medications Ordered in  ED Medications - No data to display   Initial Impression / Assessment and Plan / ED Course  I have reviewed the triage vital signs and the nursing notes.  Pertinent labs & imaging results that were available during my care of the patient were reviewed by me and considered in my medical decision making (see chart for details).     Patient presents to the emergency department for evaluation of L shoulder pain x 4 days, present mostly around the mid scapula.  Low suspicion for cardiac etiology given reassuring workup today.  EKG is nonischemic and troponin negative.  Patient has a heart score of 2 consistent with low risk of acute coronary  event.  Chest x-ray without evidence of mediastinal widening to suggest dissection.  No pneumothorax, pneumonia, pleural effusion.  Pulmonary embolus further considered; however, patient without tachycardia, tachypnea, dyspnea, hypoxia.  Well's PE score c/w low risk.  Question musculoskeletal etiology, though unable to reproduce pain.  Patient did have some improvement following use of NSAIDs.  Have counseled on the use of NSAIDs consistently.  I believe she is appropriate for continued follow-up with her primary care doctor.  Discussed with patient the need to have her blood pressure rechecked at this outpatient visit within the week.  Return precautions discussed and provided. Patient discharged in stable condition with no unaddressed concerns.   Final Clinical Impressions(s) / ED Diagnoses   Final diagnoses:  Acute pain of left shoulder    ED Discharge Orders    None       Antonietta Breach, PA-C 25/49/82 [id number]    Delora Fuel, MD 83/09/40 [phone]

## 2018-04-26 NOTE — ED Notes (Signed)
Pt was about to leave. Told her she was next.

## 2018-04-26 NOTE — Discharge Instructions (Signed)
Your work-up in the emergency department today was reassuring.  We recommend consistent use of ibuprofen or Aleve for management of your symptoms.  You may also try applying ice topically 3-4 times per day.  Follow-up with your primary care doctor within the week for repeat assessment of your symptoms as well as recheck of your blood pressure.  Continue your daily prescribed medications.  You may return for new or concerning symptoms.

## 2018-07-08 NOTE — Progress Notes (Signed)
[facility] [place], [place] [address] Phone: [phone] Subjective:    [name] am serving as a Education administrator for Dr. Hulan Saas.   CC: Back pain  OIN:OMVEHMCNOB  Dawn Odonnell is a 52 y.o. female coming in with complaint of back pain. Has had back spasms for years. Left shoulder has been achy. Tingling to right hand. Has had xrays on the shoulder. Compressed vertebra in the neck. Hip pops when she shifts weight to from side to side. 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   Onset- Chronic Location- mid back  Character- Tight Aggravating factors- twisting Reliving factors-  Therapies tried- Ice, heat, topical and oral medications Severity-7 out of 10     Past Medical History:  Diagnosis Date  . Anemia    past hx.  . Arthritis   . GERD (gastroesophageal reflux disease)   . Hypertension   . MVP (mitral valve prolapse)   . Osteoporosis    as child   . Prediabetes    was 101, no medicines    Past Surgical History:  Procedure Laterality Date  . ABDOMINAL HYSTERECTOMY    . APPENDECTOMY    . BUNIONECTOMY     x2  . PARTIAL HYSTERECTOMY    . tubal ligation     Social History   Socioeconomic History  . Marital status: Married    Spouse name: Not on file  . Number of children: Not on file  . Years of education: Not on file  . Highest education level: Not on file  Occupational History  . Not on file  Social Needs  . Financial resource strain: Not on file  . Food insecurity:    Worry: Not on file    Inability: Not on file  . Transportation needs:    Medical: Not on file    Non-medical: Not on file  Tobacco Use  . Smoking status: Never Smoker  . Smokeless tobacco: Never Used  Substance and Sexual Activity  . Alcohol use: Yes    Alcohol/week: 0.0 standard drinks    Comment: very rare   . Drug use: No  . Sexual activity: Not on file  Lifestyle  . Physical activity:    Days per week: Not on file   Minutes per session: Not on file  . Stress: Not on file  Relationships  . Social connections:    Talks on phone: Not on file    Gets together: Not on file    Attends religious service: Not on file    Active member of club or organization: Not on file    Attends meetings of clubs or organizations: Not on file    Relationship status: Not on file  Other Topics Concern  . Not on file  Social History Narrative  . Not on file   Allergies  Allergen Reactions  . Sulfa Antibiotics Hives   Family History  Problem Relation Age of Onset  . Hypertension Mother   . Stroke Mother   . Neuropathy Mother   . Heart disease Father   . Diabetes Father   . Colon cancer Neg Hx   . Esophageal cancer Neg Hx   . Rectal cancer Neg Hx   . Stomach cancer Neg Hx      Current Outpatient Medications (Cardiovascular):  .  lisinopril (PRINIVIL,ZESTRIL) 10 MG tablet, Take 10 mg by mouth daily.     Current Outpatient Medications (Other):  .  calcium carbonate (  OS-CAL) 600 MG TABS tablet, Take 600 mg by mouth 2 (two) times daily with a meal. .  Fish Oil-Cholecalciferol (FISH OIL + D3 PO), Take by mouth. Marland Kitchen  FLUoxetine (PROZAC) 20 MG capsule, Take 20 mg by mouth daily. .  Glucosamine-Chondroit-Vit C-Mn (GLUCOSAMINE 1500 COMPLEX) CAPS, Take 1 capsule by mouth daily. Marland Kitchen  MELATONIN PO, Take 15 mg by mouth at bedtime as needed.     Past medical history, social, surgical and family history all reviewed in electronic medical record.  No pertanent information unless stated regarding to the chief complaint.   Review of Systems:  No headache, visual changes, nausea, vomiting, diarrhea, constipation, dizziness, abdominal pain, skin rash, fevers, chills, night sweats, weight loss, swollen lymph nodes, body aches, joint swelling, chest pain, shortness of breath, mood changes.  Positive muscle aches  Objective  Blood pressure 130/80, pulse 70, height 5\' 3"  (1.6 m), weight 154 lb (69.9 kg), last menstrual period  07/28/2014, SpO2 92 %.    General: No apparent distress alert and oriented x3 mood and affect normal, dressed appropriately.  HEENT: Pupils equal, extraocular movements intact  Respiratory: Patient's speak in full sentences and does not appear short of breath  Cardiovascular: No lower extremity edema, non tender, no erythema  Skin: Warm dry intact with no signs of infection or rash on extremities or on axial skeleton.  Abdomen: Soft nontender  Neuro: Cranial nerves II through XII are intact, neurovascularly intact in all extremities with 2+ DTRs and 2+ pulses.  Lymph: No lymphadenopathy of posterior or anterior cervical chain or axillae bilaterally.  Gait normal with good balance and coordination.  MSK:  Non tender with full range of motion and good stability and symmetric strength and tone of shoulders, elbows, wrist, hip, knee and ankles bilaterally.  Back Exam:  Inspection: Loss of lordosis Motion: Flexion 45 deg, Extension 25 deg, Side Bending to 45 deg bilaterally,  Rotation to 45 deg bilaterally  SLR laying: Negative  XSLR laying: Negative  Palpable tenderness: Tender to palpation in the paraspinal musculature lumbar spine. FABER: Positive right. Sensory change: Gross sensation intact to all lumbar and sacral dermatomes.  Reflexes: 2+ at both patellar tendons, 2+ at achilles tendons, Babinski's downgoing.  Strength at foot  Plantar-flexion: 5/5 Dorsi-flexion: 5/5 Eversion: 5/5 Inversion: 5/5  Leg strength  Quad: 5/5 Hamstring: 5/5 Hip flexor: 5/5 Hip abductors: 5/5    97110; 15 additional minutes spent for Therapeutic exercises as stated in above notes.  This included exercises focusing on stretching, strengthening, with significant focus on eccentric aspects.   Long term goals include an improvement in range of motion, strength, endurance as well as avoiding reinjury. Patient's frequency would include in 1-2 times a day, 3-5 times a week for a duration of 6-12 weeks. Low back  exercises that included:  Pelvic tilt/bracing instruction to focus on control of the pelvic girdle and lower abdominal muscles  Glute strengthening exercises, focusing on proper firing of the glutes without engaging the low back muscles Proper stretching techniques for maximum relief for the hamstrings, hip flexors, low back and some rotation where tolerated   Proper technique shown and discussed handout in great detail with ATC.  All questions were discussed and answered.     Impression and Recommendations:     This case required medical decision making of moderate complexity. The above documentation has been reviewed and is accurate and complete Lyndal Pulley, DO       Note: This dictation was prepared with Dragon dictation  along with smaller phrase technology. Any transcriptional errors that result from this process are unintentional.

## 2018-07-09 ENCOUNTER — Encounter: Payer: Self-pay | Admitting: Family Medicine

## 2018-07-09 ENCOUNTER — Encounter

## 2018-07-09 ENCOUNTER — Ambulatory Visit: Payer: BLUE CROSS/BLUE SHIELD | Admitting: Family Medicine

## 2018-07-09 DIAGNOSIS — G5701 Lesion of sciatic nerve, right lower limb: Secondary | ICD-10-CM | POA: Diagnosis not present

## 2018-07-09 NOTE — Assessment & Plan Note (Signed)
Piriformis Syndrome  Using an anatomical model, reviewed with the patient the structures involved and how they related to diagnosis. The patient indicated understanding.   The patient was given a handout from Dr. Arne Cleveland book "The Sports Medicine Patient Advisor" describing the anatomy and rehabilitation of the following condition: Piriformis Syndrome  Also given a handout with more extensive Piriformis stretching, hip flexor and abductor strengthening, ham stretching  Rec deep massage, explained self-massage with ball Topical anti-inflammatories and over-the-counter medications. RTC in 4 weeks

## 2018-07-09 NOTE — Patient Instructions (Signed)
Good to see you  Ice is your friend Ice 20 minutes 2 times daily. Usually after activity and before bed. pennsaid pinkie amount topically 2 times daily as needed.   Exercises 3 times a week.  Tennis ball in back right pocket with sitting  Start a walk-run progression: Run max of 3 times a week    Once you have reached 30 mins: - Run 2 mins, then walk 1 min first week  -Then run 3 mins, and walk 1 min second week  -Then run 4 mins, and walk 1 min 3rd week  -Then run 5 mins, and walk 1 min.4th week -Slowly build up weekly to running 30 mins nonstop.  If painful at any of the steps, back up one step.  See me again in 4 weeks

## 2018-09-02 NOTE — Progress Notes (Signed)
[facility] [place], [place] [address] Phone: [phone] Subjective:    [name] am serving as a Education administrator for Dr. Hulan Saas.   CC: Back pain follow-up  GDJ:[id number]  Dawn Odonnell is a 52 y.o. female coming in with complaint of right sided hip. Worked out 3 days in a row last week and her back started bothering her again. Has home exercises and is doing PT. She is still painful today. Left hand is numb. Neck pain as well.  Onset- Chronic Location- Upper trap pain Aggravating factors-  Severity-7 out of 10     Past Medical History:  Diagnosis Date  . Anemia    past hx.  . Arthritis   . GERD (gastroesophageal reflux disease)   . Hypertension   . MVP (mitral valve prolapse)   . Osteoporosis    as child   . Prediabetes    was 101, no medicines    Past Surgical History:  Procedure Laterality Date  . ABDOMINAL HYSTERECTOMY    . APPENDECTOMY    . BUNIONECTOMY     x2  . PARTIAL HYSTERECTOMY    . tubal ligation     Social History   Socioeconomic History  . Marital status: Married    Spouse name: Not on file  . Number of children: Not on file  . Years of education: Not on file  . Highest education level: Not on file  Occupational History  . Not on file  Social Needs  . Financial resource strain: Not on file  . Food insecurity:    Worry: Not on file    Inability: Not on file  . Transportation needs:    Medical: Not on file    Non-medical: Not on file  Tobacco Use  . Smoking status: Never Smoker  . Smokeless tobacco: Never Used  Substance and Sexual Activity  . Alcohol use: Yes    Alcohol/week: 0.0 standard drinks    Comment: very rare   . Drug use: No  . Sexual activity: Not on file  Lifestyle  . Physical activity:    Days per week: Not on file    Minutes per session: Not on file  . Stress: Not on file  Relationships  . Social connections:    Talks on phone: Not on file    Gets together: Not on  file    Attends religious service: Not on file    Active member of club or organization: Not on file    Attends meetings of clubs or organizations: Not on file    Relationship status: Not on file  Other Topics Concern  . Not on file  Social History Narrative  . Not on file   Allergies  Allergen Reactions  . Sulfa Antibiotics Hives   Family History  Problem Relation Age of Onset  . Hypertension Mother   . Stroke Mother   . Neuropathy Mother   . Heart disease Father   . Diabetes Father   . Colon cancer Neg Hx   . Esophageal cancer Neg Hx   . Rectal cancer Neg Hx   . Stomach cancer Neg Hx     Current Outpatient Medications (Endocrine & Metabolic):  .  progesterone (PROMETRIUM) 100 MG capsule, Take 100 mg by mouth daily.  Current Outpatient Medications (Cardiovascular):  .  lisinopril (PRINIVIL,ZESTRIL) 10 MG tablet, Take 10 mg by mouth daily.     Current Outpatient Medications (Other):  Marland Kitchen  buPROPion (  WELLBUTRIN) 100 MG tablet, Take 100 mg by mouth 2 (two) times daily. .  calcium carbonate (OS-CAL) 600 MG TABS tablet, Take 600 mg by mouth 2 (two) times daily with a meal. .  Fish Oil-Cholecalciferol (FISH OIL + D3 PO), Take by mouth. .  Glucosamine-Chondroit-Vit C-Mn (GLUCOSAMINE 1500 COMPLEX) CAPS, Take 1 capsule by mouth daily. Marland Kitchen  MELATONIN PO, Take 15 mg by mouth at bedtime as needed.  Marland Kitchen  FLUoxetine (PROZAC) 20 MG capsule, Take 20 mg by mouth daily.    Past medical history, social, surgical and family history all reviewed in electronic medical record.  No pertanent information unless stated regarding to the chief complaint.   Review of Systems:  No headache, visual changes, nausea, vomiting, diarrhea, constipation, dizziness, abdominal pain, skin rash, fevers, chills, night sweats, weight loss, swollen lymph nodes, body aches, joint swelling,  chest pain, shortness of breath, mood changes.  Positive muscle aches  Objective  Blood pressure (!) 146/90, pulse 71, height  5\' 3"  (1.6 m), weight 152 lb (68.9 kg), last menstrual period 07/28/2014, SpO2 98 %.    General: No apparent distress alert and oriented x3 mood and affect normal, dressed appropriately.  HEENT: Pupils equal, extraocular movements intact  Respiratory: Patient's speak in full sentences and does not appear short of breath  Cardiovascular: No lower extremity edema, non tender, no erythema  Skin: Warm dry intact with no signs of infection or rash on extremities or on axial skeleton.  Abdomen: Soft nontender  Neuro: Cranial nerves II through XII are intact, neurovascularly intact in all extremities with 2+ DTRs and 2+ pulses.  Lymph: No lymphadenopathy of posterior or anterior cervical chain or axillae bilaterally.  Gait normal with good balance and coordination.  MSK:  Non tender with full range of motion and good stability and symmetric strength and tone of shoulders, elbows, wrist, hip, knee and ankles bilaterally.  Low back exam does show improvement.  Patient had myofascial pain in the piriformis still has some tightness but significant improvement from previous exam  Patient the left scapula does have some scapular dyskinesis.  Does have some weakness with some mild flaring of the scapula with activity.  Discussed neurovascularly intact though.  Patient shoulder exam fairly unremarkable. \ Osteopathic findings C4 flexed rotated and side bent left C6 flexed rotated and side bent left T5 extended rotated and side bent left inhaled third rib T9 extended rotated and side bent left L2 flexed rotated and side bent right Sacrum right on right   97110; 15 additional minutes spent for Therapeutic exercises as stated in above notes.  This included exercises focusing on stretching, strengthening, with significant focus on eccentric aspects.   Long term goals include an improvement in range of motion, strength, endurance as well as avoiding reinjury. Patient's frequency would include in 1-2 times a  day, 3-5 times a week for a duration of 6-12 weeks. Exercises that included:  Basic scapular stabilization to include adduction and depression of scapula Scaption, focusing on proper movement and good control Internal and External rotation utilizing a theraband, with elbow tucked at side entire time Rows with theraband    Proper technique shown and discussed handout in great detail with ATC.  All questions were discussed and answered.     Impression and Recommendations:     This case required medical decision making of moderate complexity. The above documentation has been reviewed and is accurate and complete Lyndal Pulley, DO       Note: This  dictation was prepared with Dragon dictation along with smaller phrase technology. Any transcriptional errors that result from this process are unintentional.

## 2018-09-03 ENCOUNTER — Encounter: Payer: Self-pay | Admitting: Family Medicine

## 2018-09-03 ENCOUNTER — Ambulatory Visit: Payer: BLUE CROSS/BLUE SHIELD | Admitting: Family Medicine

## 2018-09-03 DIAGNOSIS — M999 Biomechanical lesion, unspecified: Secondary | ICD-10-CM | POA: Diagnosis not present

## 2018-09-03 DIAGNOSIS — G5701 Lesion of sciatic nerve, right lower limb: Secondary | ICD-10-CM | POA: Diagnosis not present

## 2018-09-03 DIAGNOSIS — G2589 Other specified extrapyramidal and movement disorders: Secondary | ICD-10-CM | POA: Diagnosis not present

## 2018-09-03 NOTE — Assessment & Plan Note (Signed)
Improvement at this time.  Continue conservative therapy

## 2018-09-03 NOTE — Patient Instructions (Addendum)
Good to see you  Ice is your friend New exercises  Stay active Keep hands within peripheral vision at all times OK to work out.  See me again in 6 weeks!

## 2018-09-03 NOTE — Assessment & Plan Note (Signed)
Scapular dyskinesis.  Responding well to manipulation.  Discussed icing regimen and home exercise.  Discussed posture and ergonomics.  New exercises given.  Discussed which activities to do which wants to avoid.  Follow-up again in 4 to 6 weeks

## 2018-09-03 NOTE — Assessment & Plan Note (Signed)
Decision today to treat with OMT was based on Physical Exam  After verbal consent patient was treated with HVLA, ME, FPR techniques in cervical, thoracic, rib, lumbar and sacral areas  Patient tolerated the procedure well with improvement in symptoms  Patient given exercises, stretches and lifestyle modifications  See medications in patient instructions if given  Patient will follow up in 4-6 weeks 

## 2018-09-12 NOTE — Progress Notes (Signed)
[facility] [place], [place] [address] Phone: [phone] Subjective:      CC: Left shoulder pain follow-up  VZD:[id number]  Dawn Odonnell is a 52 y.o. female coming in with complaint of left shoulder pain. Very painful today. Has never had injections.  Patient states it is radiating down her arm.  Noticing even weakness in her hand.  Having difficulty with grip strength on the left side.  Pain has been unrelenting and waking her up at night.     Past Medical History:  Diagnosis Date  . Anemia    past hx.  . Arthritis   . GERD (gastroesophageal reflux disease)   . Hypertension   . MVP (mitral valve prolapse)   . Osteoporosis    as child   . Prediabetes    was 89, no medicines    Past Surgical History:  Procedure Laterality Date  . ABDOMINAL HYSTERECTOMY    . APPENDECTOMY    . BUNIONECTOMY     x2  . PARTIAL HYSTERECTOMY    . tubal ligation     Social History   Socioeconomic History  . Marital status: Married    Spouse name: Not on file  . Number of children: Not on file  . Years of education: Not on file  . Highest education level: Not on file  Occupational History  . Not on file  Social Needs  . Financial resource strain: Not on file  . Food insecurity:    Worry: Not on file    Inability: Not on file  . Transportation needs:    Medical: Not on file    Non-medical: Not on file  Tobacco Use  . Smoking status: Never Smoker  . Smokeless tobacco: Never Used  Substance and Sexual Activity  . Alcohol use: Yes    Alcohol/week: 0.0 standard drinks    Comment: very rare   . Drug use: No  . Sexual activity: Not on file  Lifestyle  . Physical activity:    Days per week: Not on file    Minutes per session: Not on file  . Stress: Not on file  Relationships  . Social connections:    Talks on phone: Not on file    Gets together: Not on file    Attends religious service: Not on file    Active member of club or  organization: Not on file    Attends meetings of clubs or organizations: Not on file    Relationship status: Not on file  Other Topics Concern  . Not on file  Social History Narrative  . Not on file   Allergies  Allergen Reactions  . Sulfa Antibiotics Hives   Family History  Problem Relation Age of Onset  . Hypertension Mother   . Stroke Mother   . Neuropathy Mother   . Heart disease Father   . Diabetes Father   . Colon cancer Neg Hx   . Esophageal cancer Neg Hx   . Rectal cancer Neg Hx   . Stomach cancer Neg Hx     Current Outpatient Medications (Endocrine & Metabolic):  .  estradiol (VIVELLE-DOT) 0.05 MG/24HR patch, Place 1 patch onto the skin 2 (two) times a week. .  progesterone (PROMETRIUM) 100 MG capsule, Take 100 mg by mouth daily. .  predniSONE (DELTASONE) 50 MG tablet, Take 1 tablet (50 mg total) by mouth daily.  Current Outpatient Medications (Cardiovascular):  .  lisinopril (PRINIVIL,ZESTRIL) 10 MG tablet, Take  10 mg by mouth daily.     Current Outpatient Medications (Other):  Marland Kitchen  buPROPion (WELLBUTRIN) 100 MG tablet, Take 100 mg by mouth 2 (two) times daily. .  calcium carbonate (OS-CAL) 600 MG TABS tablet, Take 600 mg by mouth 2 (two) times daily with a meal. .  Fish Oil-Cholecalciferol (FISH OIL + D3 PO), Take by mouth. Marland Kitchen  FLUoxetine (PROZAC) 20 MG capsule, Take 20 mg by mouth daily. .  Glucosamine-Chondroit-Vit C-Mn (GLUCOSAMINE 1500 COMPLEX) CAPS, Take 1 capsule by mouth daily. Marland Kitchen  MELATONIN PO, Take 15 mg by mouth at bedtime as needed.  .  gabapentin (NEURONTIN) 100 MG capsule, Take 2 capsules (200 mg total) by mouth at bedtime.    Past medical history, social, surgical and family history all reviewed in electronic medical record.  No pertanent information unless stated regarding to the chief complaint.   Review of Systems:  No headache, visual changes, nausea, vomiting, diarrhea, constipation, dizziness, abdominal pain, skin rash, fevers, chills,  night sweats, weight loss, swollen lymph nodes,  chest pain, shortness of breath, mood changes.  Positive muscle aches, body aches  Objective  Blood pressure (!) 150/84, pulse 62, height 5\' 3"  (1.6 m), weight 158 lb (71.7 kg), last menstrual period 07/28/2014, SpO2 98 %.   General: No apparent distress alert and oriented x3 mood and affect normal, dressed appropriately.  HEENT: Pupils equal, extraocular movements intact  Respiratory: Patient's speak in full sentences and does not appear short of breath  Cardiovascular: No lower extremity edema, non tender, no erythema  Skin: Warm dry intact with no signs of infection or rash on extremities or on axial skeleton.  Abdomen: Soft nontender  Neuro: Cranial nerves II through XII are intact, neurovascularly intact in all extremities with 2+ DTRs and 2+ pulses.  Lymph: No lymphadenopathy of posterior or anterior cervical chain or axillae bilaterally.  Gait normal with good balance and coordination.  MSK:  tender with full range of motion and good stability and symmetric strength and tone of shoulders, elbows, wrist, hip, knee and ankles bilaterally.  Mild hypermobility noted   Neck: Inspection loss of lordosis. No palpable stepoffs. Positive Spurling's maneuver. Patient does have some limited range of motion in all planes especially with sidebending and rotation especially to the left Range of motion Weakness noted with grip strength, weakness noted in the C8 distribution with 4-5 strength.  Deep tendon reflexes are likely intact Negative Hoffman sign bilaterally  Reflexes normal      Impression and Recommendations:     This case required medical decision making of moderate complexity. The above documentation has been reviewed and is accurate and complete Lyndal Pulley, DO       Note: This dictation was prepared with Dragon dictation along with smaller phrase technology. Any transcriptional errors that result from this process are  unintentional.

## 2018-09-13 ENCOUNTER — Encounter: Payer: Self-pay | Admitting: Family Medicine

## 2018-09-13 ENCOUNTER — Ambulatory Visit: Payer: BLUE CROSS/BLUE SHIELD | Admitting: Family Medicine

## 2018-09-13 ENCOUNTER — Ambulatory Visit (INDEPENDENT_AMBULATORY_CARE_PROVIDER_SITE_OTHER)
Admission: RE | Admit: 2018-09-13 | Discharge: 2018-09-13 | Disposition: A | Discharge status: Home / Self Care | Payer: BLUE CROSS/BLUE SHIELD | Source: Ambulatory Visit | Attending: Family Medicine | Admitting: Family Medicine

## 2018-09-13 VITALS — BP 150/84 | HR 62 | Ht 63.0 in | Wt 158.0 lb

## 2018-09-13 DIAGNOSIS — M5412 Radiculopathy, cervical region: Secondary | ICD-10-CM | POA: Diagnosis not present

## 2018-09-13 DIAGNOSIS — M542 Cervicalgia: Secondary | ICD-10-CM

## 2018-09-13 DIAGNOSIS — M2550 Pain in unspecified joint: Secondary | ICD-10-CM | POA: Diagnosis not present

## 2018-09-13 MED ORDER — GABAPENTIN 100 MG PO CAPS: 200.0000 mg | ORAL_CAPSULE | Freq: Every day | ORAL | 3 refills | Status: DC

## 2018-09-13 MED ORDER — PREDNISONE 50 MG PO TABS: 50.0000 mg | ORAL_TABLET | Freq: Every day | ORAL | 0 refills | Status: DC

## 2018-09-13 NOTE — Assessment & Plan Note (Signed)
Worsening symptoms.  Discussed icing regimen and home exercise.  Discussed which activities of doing which wants to avoid.  Increase activity slowly over the course of next several days though.  Patient will have new x-rays taken today.  Started prednisone and gabapentin.  Worsening symptoms with weakness will need to consider the possibility of an advanced imaging such as an MRI of the cervical spine and patient would be a candidate for epidural steroid injections.  Due to the polyarthralgia patient will also have laboratory work-up.  Follow-up with me again in 2 weeks

## 2018-09-13 NOTE — Patient Instructions (Signed)
Good to see you  Ice 20 minutes 2 times daily. Usually after activity and before bed. Lets get labs and the xray today  Hold on boot camp Exercises 3 times a week.  Prednisone daily for 5 days  Gabapentin 200mg  at night Over the counter calcium pyruvate 1500mg  daily  See me again in 2-3 weeks (ok to double book)

## 2018-09-19 ENCOUNTER — Encounter: Payer: Self-pay | Admitting: Family Medicine

## 2018-10-05 ENCOUNTER — Other Ambulatory Visit: Payer: Self-pay | Admitting: Family Medicine

## 2018-10-05 NOTE — Telephone Encounter (Signed)
Refill done.  

## 2018-10-08 ENCOUNTER — Ambulatory Visit: Payer: BLUE CROSS/BLUE SHIELD | Admitting: Family Medicine

## 2018-10-16 ENCOUNTER — Ambulatory Visit: Payer: BLUE CROSS/BLUE SHIELD | Admitting: Family Medicine

## 2019-07-04 ENCOUNTER — Encounter: Payer: Self-pay | Admitting: Internal Medicine

## 2019-08-05 ENCOUNTER — Encounter (INDEPENDENT_AMBULATORY_CARE_PROVIDER_SITE_OTHER): Payer: Self-pay

## 2019-08-11 HISTORY — PX: OTHER SURGICAL HISTORY: SHX169

## 2019-08-14 ENCOUNTER — Encounter: Payer: BLUE CROSS/BLUE SHIELD | Admitting: Internal Medicine

## 2019-11-15 ENCOUNTER — Encounter: Payer: Self-pay | Admitting: Internal Medicine

## 2019-12-20 ENCOUNTER — Ambulatory Visit (AMBULATORY_SURGERY_CENTER): Payer: BC Managed Care – PPO | Admitting: *Deleted

## 2019-12-20 ENCOUNTER — Other Ambulatory Visit: Payer: Self-pay

## 2019-12-20 VITALS — Ht 63.0 in | Wt 145.0 lb

## 2019-12-20 DIAGNOSIS — Z1159 Encounter for screening for other viral diseases: Secondary | ICD-10-CM

## 2019-12-20 DIAGNOSIS — Z86010 Personal history of colonic polyps: Secondary | ICD-10-CM

## 2019-12-20 MED ORDER — SUPREP BOWEL PREP KIT 17.5-3.13-1.6 GM/177ML PO SOLN: 1.0000 | Freq: Once | ORAL | 0 refills | Status: AC

## 2019-12-20 NOTE — Progress Notes (Signed)
PV was a virtual visit.  [phone].  Patient denies any allergies to egg or soy products. Patient denies complications with anesthesia/sedation.  Patient denies oxygen use at home and denies diet medications. Emmi instructions for colonoscopy/endoscopy explained and printed along with suprep coupon. Packet of information mailed to patien for her review.

## 2019-12-31 ENCOUNTER — Other Ambulatory Visit: Payer: Self-pay | Admitting: Internal Medicine

## 2019-12-31 ENCOUNTER — Other Ambulatory Visit: Payer: Self-pay

## 2019-12-31 ENCOUNTER — Encounter: Payer: Self-pay | Admitting: Internal Medicine

## 2019-12-31 ENCOUNTER — Ambulatory Visit (INDEPENDENT_AMBULATORY_CARE_PROVIDER_SITE_OTHER): Payer: BC Managed Care – PPO

## 2019-12-31 DIAGNOSIS — Z1159 Encounter for screening for other viral diseases: Secondary | ICD-10-CM

## 2019-12-31 LAB — SARS CORONAVIRUS 2 (TAT 6-24 HRS): SARS Coronavirus 2: NEGATIVE

## 2020-01-03 ENCOUNTER — Ambulatory Visit (AMBULATORY_SURGERY_CENTER): Payer: BC Managed Care – PPO | Admitting: Internal Medicine

## 2020-01-03 ENCOUNTER — Encounter: Payer: Self-pay | Admitting: Internal Medicine

## 2020-01-03 ENCOUNTER — Other Ambulatory Visit: Payer: Self-pay

## 2020-01-03 VITALS — BP 120/70 | HR 64 | Temp 96.6°F | Resp 10 | Ht 63.0 in | Wt 145.0 lb

## 2020-01-03 DIAGNOSIS — Z86010 Personal history of colonic polyps: Secondary | ICD-10-CM | POA: Diagnosis present

## 2020-01-03 HISTORY — PX: COLONOSCOPY: SHX174

## 2020-01-03 MED ORDER — SODIUM CHLORIDE 0.9 % IV SOLN: 500.0000 mL | Freq: Once | INTRAVENOUS | Status: DC

## 2020-01-03 NOTE — Progress Notes (Signed)
Report to PACU, RN, vss, BBS= Clear.  

## 2020-01-03 NOTE — Progress Notes (Deleted)
Pt's states no medical or surgical changes since previsit or office visit. 

## 2020-01-03 NOTE — Progress Notes (Signed)
Pt's states no medical or surgical changes since previsit or office visit.  Temp LC Vitals CW

## 2020-01-03 NOTE — Patient Instructions (Signed)
Please read handouts provided. Continue present medications.   YOU HAD AN ENDOSCOPIC PROCEDURE TODAY AT THE [facility]:   Refer to the procedure report that was given to you for any specific questions about what was found during the examination.  If the procedure report does not answer your questions, please call your gastroenterologist to clarify.  If you requested that your care partner not be given the details of your procedure findings, then the procedure report has been included in a sealed envelope for you to review at your convenience later.  YOU SHOULD EXPECT: Some feelings of bloating in the abdomen. Passage of more gas than usual.  Walking can help get rid of the air that was put into your GI tract during the procedure and reduce the bloating. If you had a lower endoscopy (such as a colonoscopy or flexible sigmoidoscopy) you may notice spotting of blood in your stool or on the toilet paper. If you underwent a bowel prep for your procedure, you may not have a normal bowel movement for a few days.  Please Note:  You might notice some irritation and congestion in your nose or some drainage.  This is from the oxygen used during your procedure.  There is no need for concern and it should clear up in a day or so.  SYMPTOMS TO REPORT IMMEDIATELY:  Following lower endoscopy (colonoscopy or flexible sigmoidoscopy):  Excessive amounts of blood in the stool  Significant tenderness or worsening of abdominal pains  Swelling of the abdomen that is new, acute  Fever of 100F or higher   For urgent or emergent issues, a gastroenterologist can be reached at any hour by calling [phone]. Do not use MyChart messaging for urgent concerns.    DIET:  We do recommend a small meal at first, but then you may proceed to your regular diet.  Drink plenty of fluids but you should avoid alcoholic beverages for 24 hours.  ACTIVITY:  You should plan to take it easy for the rest of today and  you should NOT DRIVE or use heavy machinery until tomorrow (because of the sedation medicines used during the test).    FOLLOW UP: Our staff will call the number listed on your records 48-72 hours following your procedure to check on you and address any questions or concerns that you may have regarding the information given to you following your procedure. If we do not reach you, we will leave a message.  We will attempt to reach you two times.  During this call, we will ask if you have developed any symptoms of COVID 19. If you develop any symptoms (ie: fever, flu-like symptoms, shortness of breath, cough etc.) before then, please call [phone].  If you test positive for Covid 19 in the 2 weeks post procedure, please call and report this information to us.    If any biopsies were taken you will be contacted by phone or by letter within the next 1-3 weeks.  Please call us at [phone] if you have not heard about the biopsies in 3 weeks.    SIGNATURES/CONFIDENTIALITY: You and/or your care partner have signed paperwork which will be entered into your electronic medical record.  These signatures attest to the fact that that the information above on your After Visit Summary has been reviewed and is understood.  Full responsibility of the confidentiality of this discharge information lies with you and/or your care-partner.  

## 2020-01-03 NOTE — Op Note (Signed)
[facility] Patient Name: Emberlin Hower Procedure Date: 01/03/2020 11:20 AM MRN: [id number] Endoscopist: Jerene Bears , MD Age: 54 Referring MD:  Date of Birth: Feb 10, 1966 Gender: Female Account #: [id number] Procedure:                Colonoscopy Indications:              High risk colon cancer surveillance: Personal                            history of non-advanced adenoma, Last colonoscopy:                            November 2015 Medicines:                Monitored Anesthesia Care Procedure:                Pre-Anesthesia Assessment:                           - Prior to the procedure, a History and Physical                            was performed, and patient medications and                            allergies were reviewed. The patient's tolerance of                            previous anesthesia was also reviewed. The risks                            and benefits of the procedure and the sedation                            options and risks were discussed with the patient.                            All questions were answered, and informed consent                            was obtained. Prior Anticoagulants: The patient has                            taken no previous anticoagulant or antiplatelet                            agents. ASA Grade Assessment: II - A patient with                            mild systemic disease. After reviewing the risks                            and benefits, the patient was deemed in  satisfactory condition to undergo the procedure.                           After obtaining informed consent, the colonoscope                            was passed under direct vision. Throughout the                            procedure, the patient's blood pressure, pulse, and                            oxygen saturations were monitored continuously. The                            Colonoscope was introduced through the anus and                           advanced to the cecum, identified by appendiceal                            orifice and ileocecal valve. The colonoscopy was                            performed without difficulty. The patient tolerated                            the procedure well. The quality of the bowel                            preparation was good. The ileocecal valve,                            appendiceal orifice, and rectum were photographed. Scope In: 11:31:47 AM Scope Out: 11:44:41 AM Scope Withdrawal Time: 0 hours 7 minutes 47 seconds  Total Procedure Duration: 0 hours 12 minutes 54 seconds  Findings:                 The digital rectal exam was normal.                           Multiple small-mouthed diverticula were found in                            the descending colon and transverse colon.                           Internal hemorrhoids were found during                            retroflexion. The hemorrhoids were small.                           The exam was otherwise without abnormality. Complications:            No immediate complications.  Estimated Blood Loss:     Estimated blood loss: none. Impression:               - Diverticulosis in the descending colon and in the                            transverse colon.                           - Small internal hemorrhoids.                           - The examination was otherwise normal.                           - No specimens collected. Recommendation:           - Patient has a contact number available for                            emergencies. The signs and symptoms of potential                            delayed complications were discussed with the                            patient. Return to normal activities tomorrow.                            Written discharge instructions were provided to the                            patient.                           - Resume previous diet.                           - Continue  present medications.                           - Repeat colonoscopy in 10 years for surveillance. Jerene Bears, MD 01/03/2020 11:46:52 AM This report has been signed electronically.

## 2020-01-07 ENCOUNTER — Telehealth: Payer: Self-pay | Admitting: *Deleted

## 2020-01-07 NOTE — Telephone Encounter (Signed)
  Follow up Call-  Call back number 01/03/2020  Post procedure Call Back phone  # [phone]  Permission to leave phone message Yes  Some recent data might be hidden     Patient questions:  Do you have a fever, pain , or abdominal swelling? No. Pain Score  0 *  Have you tolerated food without any problems? Yes.    Have you been able to return to your normal activities? Yes.    Do you have any questions about your discharge instructions: Diet   No. Medications  No. Follow up visit  No.  Do you have questions or concerns about your Care? No.  Actions: * If pain score is 4 or above: No action needed, pain <4.  1. Have you developed a fever since your procedure? no  2.   Have you had an respiratory symptoms (SOB or cough) since your procedure? no  3.   Have you tested positive for COVID 19 since your procedure no  4.   Have you had any family members/close contacts diagnosed with the COVID 19 since your procedure?  no   If yes to any of these questions please route to Joylene John, RN and Erenest Rasher, RN

## 2020-08-08 IMAGING — DX DG CERVICAL SPINE COMPLETE 4+V
6 series · 6 of 6 positions shown · non-contrast
Comparison: None.

CLINICAL DATA: 52-year-old female with a history of cervical spine
pain

EXAM:
CERVICAL SPINE - COMPLETE 4+ VIEW

[c-spine lat]
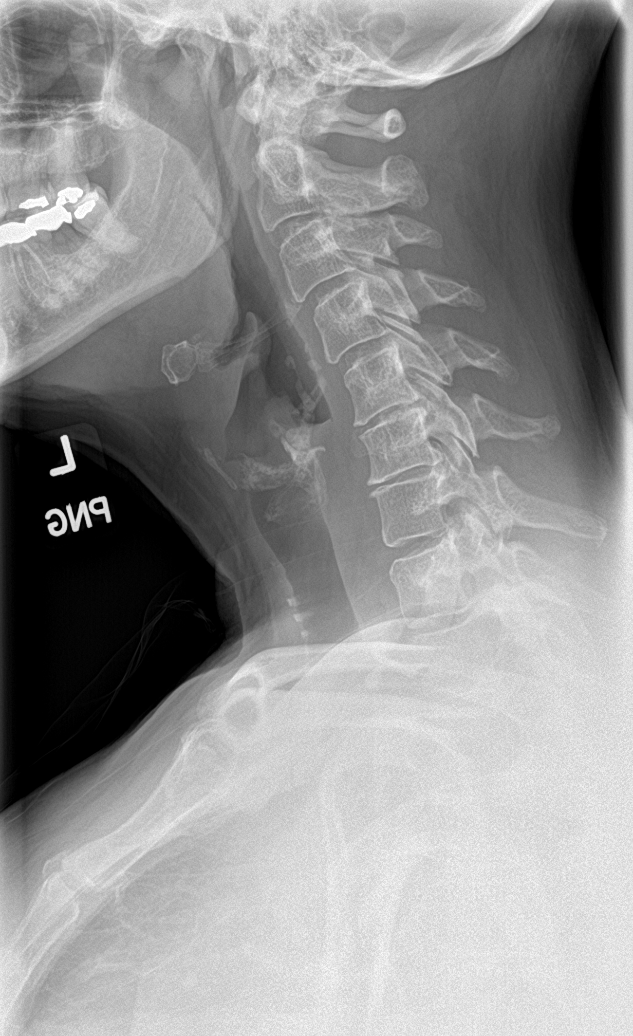

[c-spine obl (1 of 2)]
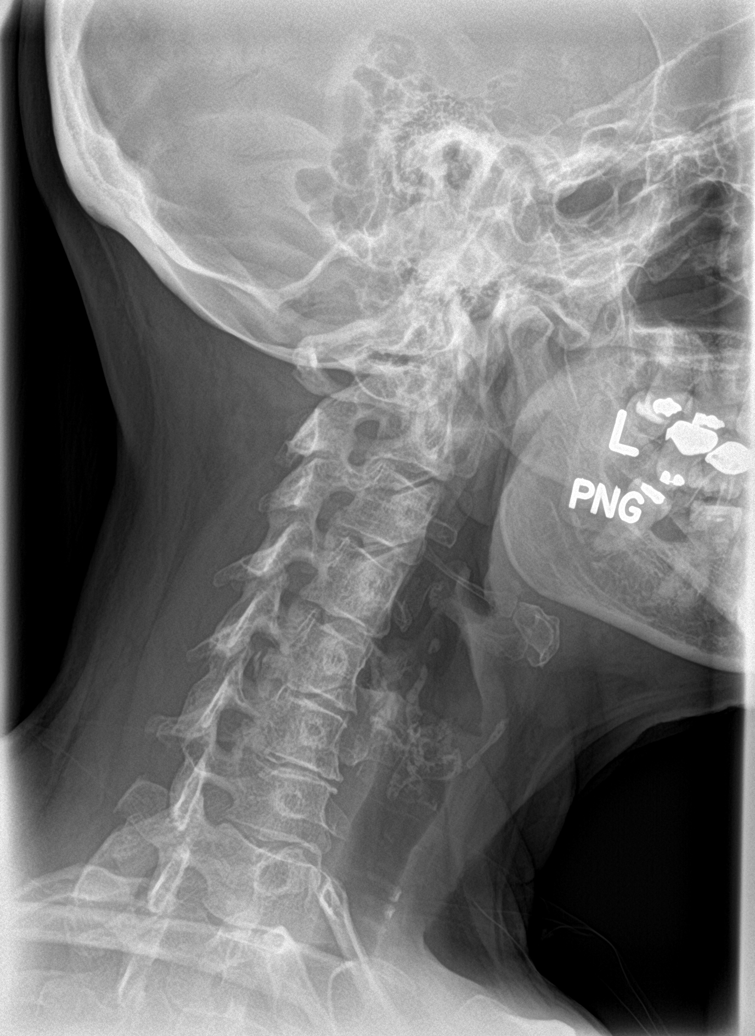

[c-spine obl (2 of 2)]
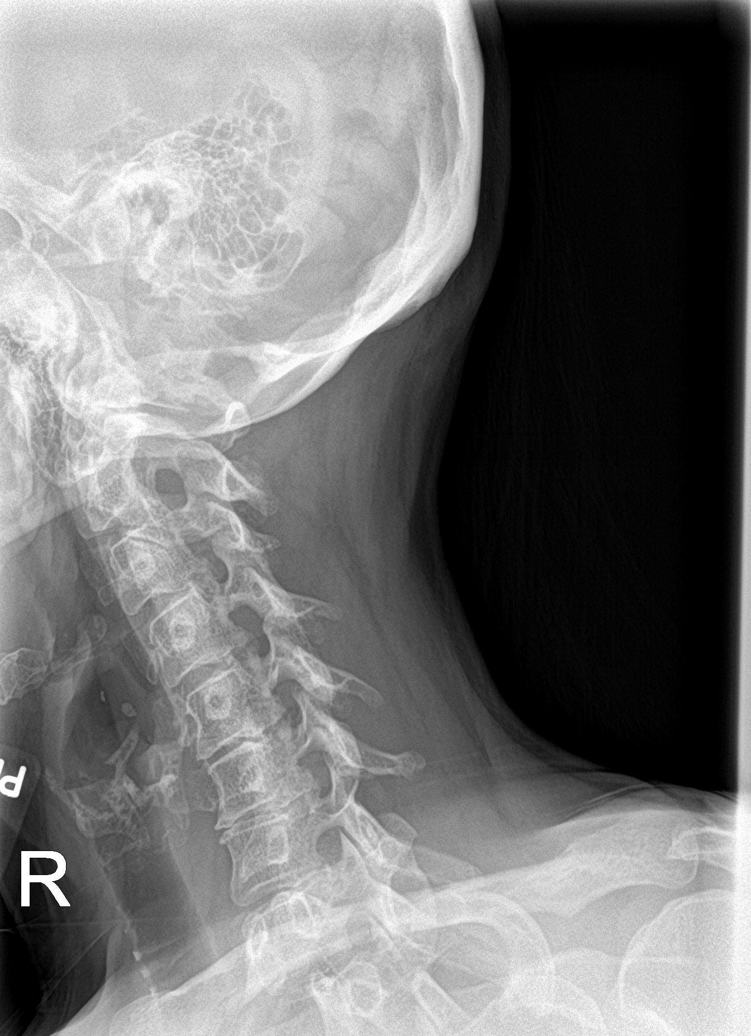

[c-spine ap]
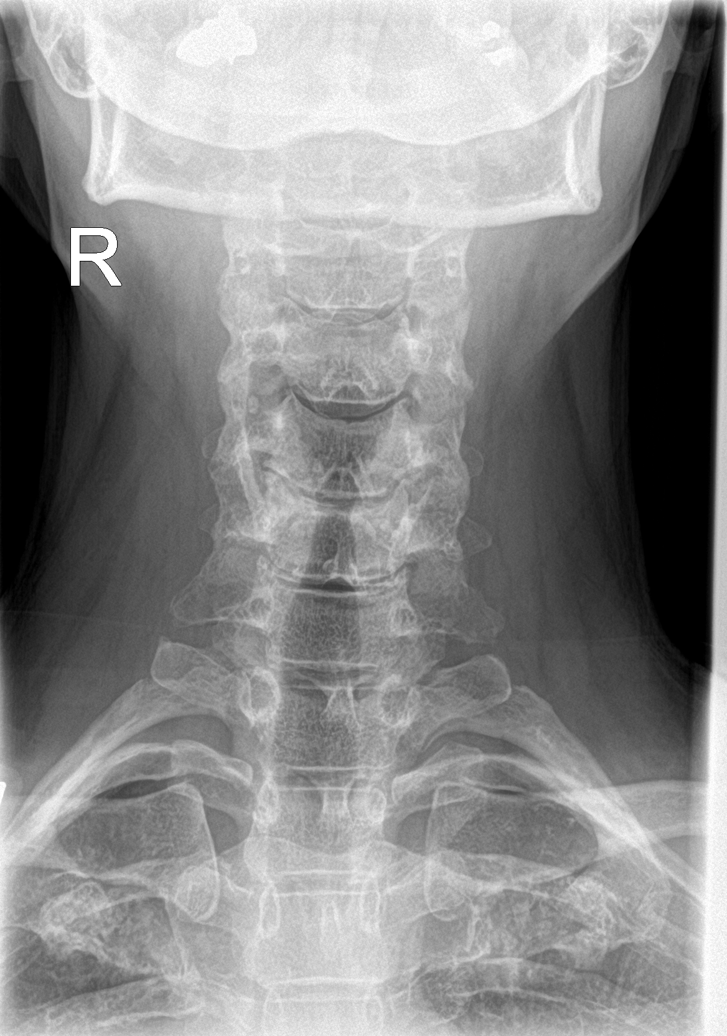

[c-spine open mouth]
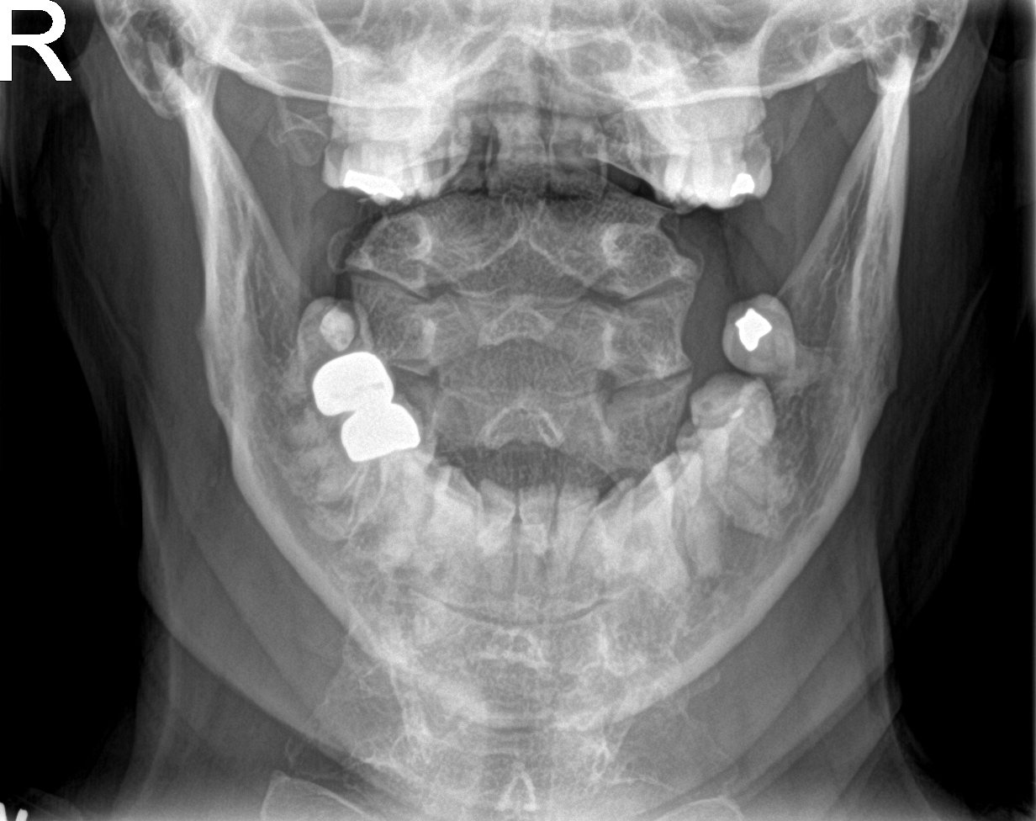

[swimmer]
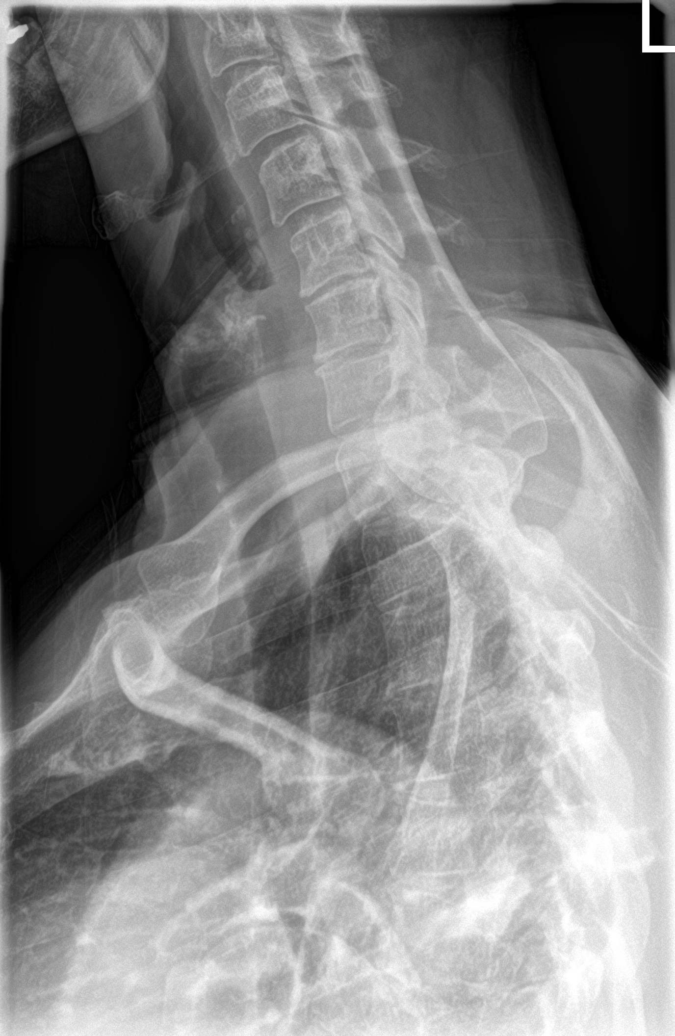

[6 of 6 positions shown; findings below may reference images not displayed]

FINDINGS: Cervical Spine:

Cervical elements maintain relative anatomic alignment from the
level of C1-T1. Unremarkable appearance of the craniocervical
junction.

Reversal of normal cervical lordosis.  No subluxation.

No acute fracture line identified.

Vertebral body heights maintained.

Mild disc space narrowing throughout, most pronounced at C5-C6 and
C6-C7

Right oblique images demonstrate foraminal encroachment at C5-C6
secondary to uncovertebral joint disease and facet disease. More
mild encroachment at C3-C4.

Left oblique images demonstrate mild foraminal encroachment at
C3-C4, C4-C5, and C5-C6 secondary to uncovertebral joint disease.

Open mouth odontoid view unremarkable.
IMPRESSION: Negative for acute fracture malalignment of the cervical spine.

Degenerative disc disease, with early foraminal encroachment, most
pronounced on the right at C5-C6.

## 2020-10-11 DIAGNOSIS — Z03818 Encounter for observation for suspected exposure to other biological agents ruled out: Secondary | ICD-10-CM | POA: Diagnosis not present

## 2020-10-11 DIAGNOSIS — R509 Fever, unspecified: Secondary | ICD-10-CM | POA: Diagnosis not present

## 2020-10-11 DIAGNOSIS — Z1152 Encounter for screening for COVID-19: Secondary | ICD-10-CM | POA: Diagnosis not present

## 2020-10-11 DIAGNOSIS — R0981 Nasal congestion: Secondary | ICD-10-CM | POA: Diagnosis not present

## 2020-10-21 DIAGNOSIS — Z1231 Encounter for screening mammogram for malignant neoplasm of breast: Secondary | ICD-10-CM | POA: Diagnosis not present

## 2020-11-27 DIAGNOSIS — Z20822 Contact with and (suspected) exposure to covid-19: Secondary | ICD-10-CM | POA: Diagnosis not present

## 2021-03-23 DIAGNOSIS — L820 Inflamed seborrheic keratosis: Secondary | ICD-10-CM | POA: Diagnosis not present

## 2021-03-23 DIAGNOSIS — D225 Melanocytic nevi of trunk: Secondary | ICD-10-CM | POA: Diagnosis not present

## 2021-03-23 DIAGNOSIS — L814 Other melanin hyperpigmentation: Secondary | ICD-10-CM | POA: Diagnosis not present

## 2021-03-23 DIAGNOSIS — L720 Epidermal cyst: Secondary | ICD-10-CM | POA: Diagnosis not present

## 2021-03-23 DIAGNOSIS — D2361 Other benign neoplasm of skin of right upper limb, including shoulder: Secondary | ICD-10-CM | POA: Diagnosis not present

## 2021-03-23 DIAGNOSIS — I788 Other diseases of capillaries: Secondary | ICD-10-CM | POA: Diagnosis not present

## 2021-08-13 DIAGNOSIS — L719 Rosacea, unspecified: Secondary | ICD-10-CM | POA: Diagnosis not present

## 2021-08-13 DIAGNOSIS — Z23 Encounter for immunization: Secondary | ICD-10-CM | POA: Diagnosis not present

## 2021-08-13 DIAGNOSIS — Z13220 Encounter for screening for lipoid disorders: Secondary | ICD-10-CM | POA: Diagnosis not present

## 2021-08-13 DIAGNOSIS — I10 Essential (primary) hypertension: Secondary | ICD-10-CM | POA: Diagnosis not present

## 2021-08-13 DIAGNOSIS — N951 Menopausal and female climacteric states: Secondary | ICD-10-CM | POA: Diagnosis not present

## 2021-08-13 DIAGNOSIS — R69 Illness, unspecified: Secondary | ICD-10-CM | POA: Diagnosis not present

## 2021-08-13 DIAGNOSIS — Z131 Encounter for screening for diabetes mellitus: Secondary | ICD-10-CM | POA: Diagnosis not present

## 2021-08-13 DIAGNOSIS — Z0000 Encounter for general adult medical examination without abnormal findings: Secondary | ICD-10-CM | POA: Diagnosis not present

## 2021-08-30 DIAGNOSIS — N951 Menopausal and female climacteric states: Secondary | ICD-10-CM | POA: Diagnosis not present

## 2021-08-30 DIAGNOSIS — Z1389 Encounter for screening for other disorder: Secondary | ICD-10-CM | POA: Diagnosis not present

## 2021-08-30 DIAGNOSIS — Z6826 Body mass index (BMI) 26.0-26.9, adult: Secondary | ICD-10-CM | POA: Diagnosis not present

## 2021-08-30 DIAGNOSIS — Z01419 Encounter for gynecological examination (general) (routine) without abnormal findings: Secondary | ICD-10-CM | POA: Diagnosis not present

## 2021-09-13 DIAGNOSIS — M542 Cervicalgia: Secondary | ICD-10-CM | POA: Diagnosis not present

## 2021-12-22 DIAGNOSIS — Z6826 Body mass index (BMI) 26.0-26.9, adult: Secondary | ICD-10-CM | POA: Diagnosis not present

## 2021-12-22 DIAGNOSIS — Z13228 Encounter for screening for other metabolic disorders: Secondary | ICD-10-CM | POA: Diagnosis not present

## 2021-12-22 DIAGNOSIS — E663 Overweight: Secondary | ICD-10-CM | POA: Diagnosis not present

## 2021-12-22 DIAGNOSIS — E668 Other obesity: Secondary | ICD-10-CM | POA: Diagnosis not present

## 2021-12-27 DIAGNOSIS — L720 Epidermal cyst: Secondary | ICD-10-CM | POA: Diagnosis not present

## 2022-03-23 DIAGNOSIS — L570 Actinic keratosis: Secondary | ICD-10-CM | POA: Diagnosis not present

## 2022-03-23 DIAGNOSIS — D2361 Other benign neoplasm of skin of right upper limb, including shoulder: Secondary | ICD-10-CM | POA: Diagnosis not present

## 2022-03-23 DIAGNOSIS — L720 Epidermal cyst: Secondary | ICD-10-CM | POA: Diagnosis not present

## 2022-03-23 DIAGNOSIS — L918 Other hypertrophic disorders of the skin: Secondary | ICD-10-CM | POA: Diagnosis not present

## 2022-03-23 DIAGNOSIS — L814 Other melanin hyperpigmentation: Secondary | ICD-10-CM | POA: Diagnosis not present

## 2022-03-23 DIAGNOSIS — D2272 Melanocytic nevi of left lower limb, including hip: Secondary | ICD-10-CM | POA: Diagnosis not present

## 2022-03-23 DIAGNOSIS — D225 Melanocytic nevi of trunk: Secondary | ICD-10-CM | POA: Diagnosis not present

## 2022-03-23 DIAGNOSIS — D2262 Melanocytic nevi of left upper limb, including shoulder: Secondary | ICD-10-CM | POA: Diagnosis not present

## 2022-03-23 DIAGNOSIS — D1801 Hemangioma of skin and subcutaneous tissue: Secondary | ICD-10-CM | POA: Diagnosis not present

## 2022-03-23 DIAGNOSIS — D2239 Melanocytic nevi of other parts of face: Secondary | ICD-10-CM | POA: Diagnosis not present

## 2022-06-28 DIAGNOSIS — R300 Dysuria: Secondary | ICD-10-CM | POA: Diagnosis not present

## 2022-08-16 DIAGNOSIS — R635 Abnormal weight gain: Secondary | ICD-10-CM | POA: Diagnosis not present

## 2022-08-16 DIAGNOSIS — E785 Hyperlipidemia, unspecified: Secondary | ICD-10-CM | POA: Diagnosis not present

## 2022-08-16 DIAGNOSIS — Z0000 Encounter for general adult medical examination without abnormal findings: Secondary | ICD-10-CM | POA: Diagnosis not present

## 2022-08-16 DIAGNOSIS — R69 Illness, unspecified: Secondary | ICD-10-CM | POA: Diagnosis not present

## 2022-08-16 DIAGNOSIS — I10 Essential (primary) hypertension: Secondary | ICD-10-CM | POA: Diagnosis not present

## 2022-09-30 DIAGNOSIS — M5451 Vertebrogenic low back pain: Secondary | ICD-10-CM | POA: Diagnosis not present

## 2022-10-26 DIAGNOSIS — M545 Low back pain, unspecified: Secondary | ICD-10-CM | POA: Diagnosis not present

## 2022-11-09 DIAGNOSIS — M545 Low back pain, unspecified: Secondary | ICD-10-CM | POA: Diagnosis not present

## 2022-11-10 DIAGNOSIS — R69 Illness, unspecified: Secondary | ICD-10-CM | POA: Diagnosis not present

## 2022-11-10 DIAGNOSIS — Z124 Encounter for screening for malignant neoplasm of cervix: Secondary | ICD-10-CM | POA: Diagnosis not present

## 2023-05-12 DIAGNOSIS — S70362A Insect bite (nonvenomous), left thigh, initial encounter: Secondary | ICD-10-CM | POA: Diagnosis not present

## 2023-05-12 DIAGNOSIS — E663 Overweight: Secondary | ICD-10-CM | POA: Diagnosis not present

## 2023-05-12 DIAGNOSIS — S70361A Insect bite (nonvenomous), right thigh, initial encounter: Secondary | ICD-10-CM | POA: Diagnosis not present

## 2023-05-12 DIAGNOSIS — W57XXXA Bitten or stung by nonvenomous insect and other nonvenomous arthropods, initial encounter: Secondary | ICD-10-CM | POA: Diagnosis not present

## 2023-05-12 DIAGNOSIS — M545 Low back pain, unspecified: Secondary | ICD-10-CM | POA: Diagnosis not present

## 2023-05-25 DIAGNOSIS — Z1152 Encounter for screening for COVID-19: Secondary | ICD-10-CM | POA: Diagnosis not present

## 2023-05-25 DIAGNOSIS — Z20822 Contact with and (suspected) exposure to covid-19: Secondary | ICD-10-CM | POA: Diagnosis not present

## 2023-05-27 DIAGNOSIS — J208 Acute bronchitis due to other specified organisms: Secondary | ICD-10-CM | POA: Diagnosis not present

## 2023-05-30 DIAGNOSIS — L814 Other melanin hyperpigmentation: Secondary | ICD-10-CM | POA: Diagnosis not present

## 2023-05-30 DIAGNOSIS — B351 Tinea unguium: Secondary | ICD-10-CM | POA: Diagnosis not present

## 2023-05-30 DIAGNOSIS — L918 Other hypertrophic disorders of the skin: Secondary | ICD-10-CM | POA: Diagnosis not present

## 2023-05-30 DIAGNOSIS — D224 Melanocytic nevi of scalp and neck: Secondary | ICD-10-CM | POA: Diagnosis not present

## 2023-05-30 DIAGNOSIS — D225 Melanocytic nevi of trunk: Secondary | ICD-10-CM | POA: Diagnosis not present

## 2023-05-30 DIAGNOSIS — L720 Epidermal cyst: Secondary | ICD-10-CM | POA: Diagnosis not present

## 2023-05-30 DIAGNOSIS — D2239 Melanocytic nevi of other parts of face: Secondary | ICD-10-CM | POA: Diagnosis not present

## 2023-07-03 DIAGNOSIS — M542 Cervicalgia: Secondary | ICD-10-CM | POA: Diagnosis not present

## 2023-07-26 DIAGNOSIS — E663 Overweight: Secondary | ICD-10-CM | POA: Diagnosis not present

## 2023-07-26 DIAGNOSIS — M5030 Other cervical disc degeneration, unspecified cervical region: Secondary | ICD-10-CM | POA: Diagnosis not present

## 2023-09-04 DIAGNOSIS — E785 Hyperlipidemia, unspecified: Secondary | ICD-10-CM | POA: Diagnosis not present

## 2023-09-04 DIAGNOSIS — Z6829 Body mass index (BMI) 29.0-29.9, adult: Secondary | ICD-10-CM | POA: Diagnosis not present

## 2023-09-04 DIAGNOSIS — Z124 Encounter for screening for malignant neoplasm of cervix: Secondary | ICD-10-CM | POA: Diagnosis not present

## 2023-09-04 DIAGNOSIS — Z0000 Encounter for general adult medical examination without abnormal findings: Secondary | ICD-10-CM | POA: Diagnosis not present

## 2023-09-04 DIAGNOSIS — I10 Essential (primary) hypertension: Secondary | ICD-10-CM | POA: Diagnosis not present

## 2023-09-04 DIAGNOSIS — Z1231 Encounter for screening mammogram for malignant neoplasm of breast: Secondary | ICD-10-CM | POA: Diagnosis not present

## 2023-09-04 DIAGNOSIS — Z1211 Encounter for screening for malignant neoplasm of colon: Secondary | ICD-10-CM | POA: Diagnosis not present

## 2023-09-04 DIAGNOSIS — F419 Anxiety disorder, unspecified: Secondary | ICD-10-CM | POA: Diagnosis not present

## 2023-09-04 DIAGNOSIS — Z23 Encounter for immunization: Secondary | ICD-10-CM | POA: Diagnosis not present

## 2023-09-29 DIAGNOSIS — M542 Cervicalgia: Secondary | ICD-10-CM | POA: Diagnosis not present

## 2023-09-29 DIAGNOSIS — M5412 Radiculopathy, cervical region: Secondary | ICD-10-CM | POA: Diagnosis not present

## 2024-03-11 ENCOUNTER — Ambulatory Visit: Admitting: Podiatry

## 2024-03-18 ENCOUNTER — Ambulatory Visit: Admitting: Podiatry
